# Patient Record
Sex: Female | Born: 1975 | Race: White | Hispanic: No | Marital: Single | Smoking: Never smoker
Health system: Southern US, Community
[De-identification: ages and names within clinical notes are randomized; demographics above are authoritative.]

## PROBLEM LIST (undated history)

## (undated) DIAGNOSIS — F419 Anxiety disorder, unspecified: Secondary | ICD-10-CM

## (undated) DIAGNOSIS — E039 Hypothyroidism, unspecified: Secondary | ICD-10-CM

## (undated) DIAGNOSIS — M797 Fibromyalgia: Secondary | ICD-10-CM

## (undated) DIAGNOSIS — F319 Bipolar disorder, unspecified: Secondary | ICD-10-CM

## (undated) DIAGNOSIS — G5600 Carpal tunnel syndrome, unspecified upper limb: Secondary | ICD-10-CM

## (undated) HISTORY — DX: Fibromyalgia: M79.7

## (undated) HISTORY — DX: Carpal tunnel syndrome, unspecified upper limb: G56.00

## (undated) HISTORY — DX: Anxiety disorder, unspecified: F41.9

## (undated) HISTORY — DX: Bipolar disorder, unspecified: F31.9

## (undated) HISTORY — DX: Hypothyroidism, unspecified: E03.9

---

## 1997-06-28 ENCOUNTER — Inpatient Hospital Stay (HOSPITAL_COMMUNITY): Admission: AD | Admit: 1997-06-28 | Discharge: 1997-06-28 | Payer: Self-pay | Admitting: Obstetrics and Gynecology

## 1997-07-01 ENCOUNTER — Ambulatory Visit (HOSPITAL_COMMUNITY): Admission: RE | Admit: 1997-07-01 | Discharge: 1997-07-01 | Payer: Self-pay | Admitting: Obstetrics and Gynecology

## 1997-07-15 ENCOUNTER — Ambulatory Visit (HOSPITAL_COMMUNITY): Admission: RE | Admit: 1997-07-15 | Discharge: 1997-07-15 | Payer: Self-pay | Admitting: Obstetrics and Gynecology

## 1997-08-25 ENCOUNTER — Inpatient Hospital Stay (HOSPITAL_COMMUNITY): Admission: AD | Admit: 1997-08-25 | Discharge: 1997-08-25 | Payer: Self-pay | Admitting: Obstetrics and Gynecology

## 1997-08-28 ENCOUNTER — Ambulatory Visit (HOSPITAL_COMMUNITY): Admission: RE | Admit: 1997-08-28 | Discharge: 1997-08-28 | Payer: Self-pay | Admitting: Obstetrics and Gynecology

## 1997-10-17 ENCOUNTER — Other Ambulatory Visit: Admission: RE | Admit: 1997-10-17 | Discharge: 1997-10-17 | Payer: Self-pay | Admitting: Obstetrics and Gynecology

## 1997-12-15 ENCOUNTER — Ambulatory Visit (HOSPITAL_COMMUNITY): Admission: RE | Admit: 1997-12-15 | Discharge: 1997-12-15 | Payer: Self-pay | Admitting: Obstetrics and Gynecology

## 1997-12-28 ENCOUNTER — Inpatient Hospital Stay (HOSPITAL_COMMUNITY): Admission: AD | Admit: 1997-12-28 | Discharge: 1997-12-28 | Payer: Self-pay | Admitting: Obstetrics and Gynecology

## 1997-12-29 ENCOUNTER — Encounter: Payer: Self-pay | Admitting: Obstetrics and Gynecology

## 1997-12-29 ENCOUNTER — Inpatient Hospital Stay (HOSPITAL_COMMUNITY): Admission: AD | Admit: 1997-12-29 | Discharge: 1997-12-29 | Payer: Self-pay | Admitting: Obstetrics and Gynecology

## 1997-12-30 ENCOUNTER — Inpatient Hospital Stay (HOSPITAL_COMMUNITY): Admission: AD | Admit: 1997-12-30 | Discharge: 1997-12-30 | Payer: Self-pay | Admitting: Obstetrics and Gynecology

## 1998-01-14 ENCOUNTER — Inpatient Hospital Stay (HOSPITAL_COMMUNITY): Admission: AD | Admit: 1998-01-14 | Discharge: 1998-01-14 | Payer: Self-pay | Admitting: Obstetrics and Gynecology

## 1998-01-23 ENCOUNTER — Ambulatory Visit (HOSPITAL_COMMUNITY): Admission: RE | Admit: 1998-01-23 | Discharge: 1998-01-23 | Payer: Self-pay | Admitting: Obstetrics and Gynecology

## 1998-04-20 ENCOUNTER — Inpatient Hospital Stay (HOSPITAL_COMMUNITY): Admission: AD | Admit: 1998-04-20 | Discharge: 1998-04-20 | Payer: Self-pay | Admitting: Obstetrics and Gynecology

## 1998-04-24 ENCOUNTER — Inpatient Hospital Stay (HOSPITAL_COMMUNITY): Admission: AD | Admit: 1998-04-24 | Discharge: 1998-04-24 | Payer: Self-pay | Admitting: Obstetrics and Gynecology

## 1998-04-27 ENCOUNTER — Encounter (HOSPITAL_COMMUNITY): Admission: RE | Admit: 1998-04-27 | Discharge: 1998-05-07 | Payer: Self-pay | Admitting: Obstetrics and Gynecology

## 1998-05-05 ENCOUNTER — Inpatient Hospital Stay (HOSPITAL_COMMUNITY): Admission: AD | Admit: 1998-05-05 | Discharge: 1998-05-07 | Payer: Self-pay | Admitting: Obstetrics and Gynecology

## 2004-08-23 ENCOUNTER — Other Ambulatory Visit: Admission: RE | Admit: 2004-08-23 | Discharge: 2004-08-23 | Payer: Self-pay | Admitting: Obstetrics and Gynecology

## 2005-03-08 ENCOUNTER — Inpatient Hospital Stay (HOSPITAL_COMMUNITY): Admission: AD | Admit: 2005-03-08 | Discharge: 2005-03-09 | Payer: Self-pay | Admitting: Obstetrics and Gynecology

## 2005-03-25 ENCOUNTER — Inpatient Hospital Stay (HOSPITAL_COMMUNITY): Admission: AD | Admit: 2005-03-25 | Discharge: 2005-03-26 | Payer: Self-pay | Admitting: Obstetrics and Gynecology

## 2005-06-18 ENCOUNTER — Inpatient Hospital Stay (HOSPITAL_COMMUNITY): Admission: AD | Admit: 2005-06-18 | Discharge: 2005-06-18 | Payer: Self-pay | Admitting: Obstetrics and Gynecology

## 2005-08-04 ENCOUNTER — Observation Stay (HOSPITAL_COMMUNITY): Admission: AD | Admit: 2005-08-04 | Discharge: 2005-08-04 | Payer: Self-pay | Admitting: Obstetrics and Gynecology

## 2005-08-04 ENCOUNTER — Inpatient Hospital Stay (HOSPITAL_COMMUNITY): Admission: AD | Admit: 2005-08-04 | Discharge: 2005-08-04 | Payer: Self-pay | Admitting: Obstetrics and Gynecology

## 2005-08-15 ENCOUNTER — Inpatient Hospital Stay (HOSPITAL_COMMUNITY): Admission: AD | Admit: 2005-08-15 | Discharge: 2005-08-15 | Payer: Self-pay | Admitting: Obstetrics and Gynecology

## 2005-08-20 ENCOUNTER — Encounter: Payer: Self-pay | Admitting: Obstetrics and Gynecology

## 2005-08-27 ENCOUNTER — Ambulatory Visit (HOSPITAL_COMMUNITY): Admission: RE | Admit: 2005-08-27 | Discharge: 2005-08-27 | Payer: Self-pay | Admitting: Obstetrics and Gynecology

## 2005-10-11 ENCOUNTER — Inpatient Hospital Stay (HOSPITAL_COMMUNITY): Admission: AD | Admit: 2005-10-11 | Discharge: 2005-10-11 | Payer: Self-pay | Admitting: Obstetrics and Gynecology

## 2005-10-22 ENCOUNTER — Inpatient Hospital Stay (HOSPITAL_COMMUNITY): Admission: AD | Admit: 2005-10-22 | Discharge: 2005-10-24 | Payer: Self-pay | Admitting: Obstetrics and Gynecology

## 2005-12-02 ENCOUNTER — Other Ambulatory Visit: Admission: RE | Admit: 2005-12-02 | Discharge: 2005-12-02 | Payer: Self-pay | Admitting: Obstetrics and Gynecology

## 2006-11-12 IMAGING — US US OB LIMITED
1 series · 14 of 28 positions shown · non-contrast
Comparison: 03/25/05.

CLINICAL DATA: 37 weeks pregnant female with abdominal pain.
 LIMITED OBSTETRICAL ULTRASOUND:

[Series 1: us ob limited · 0.29mm/px · 14 of 38 slices shown]
[im 2/38]
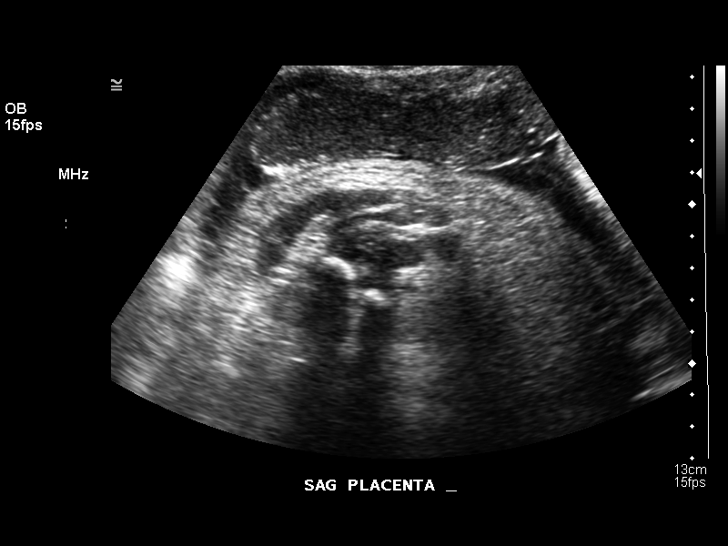
[im 5/38]
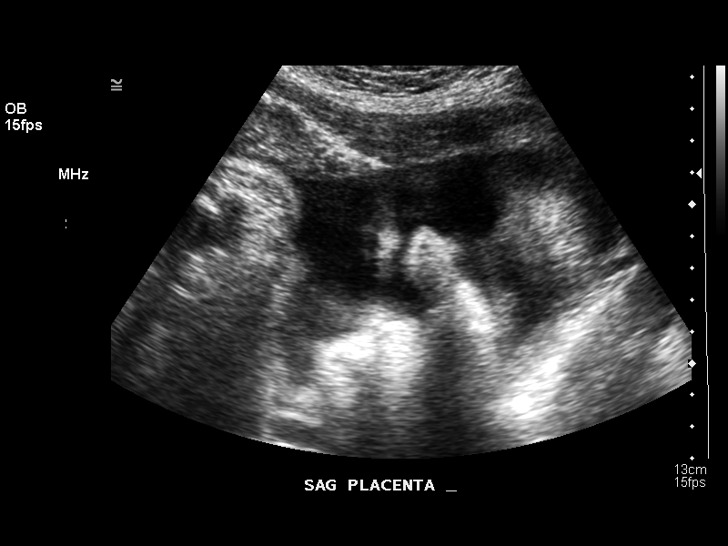
[im 7/38]
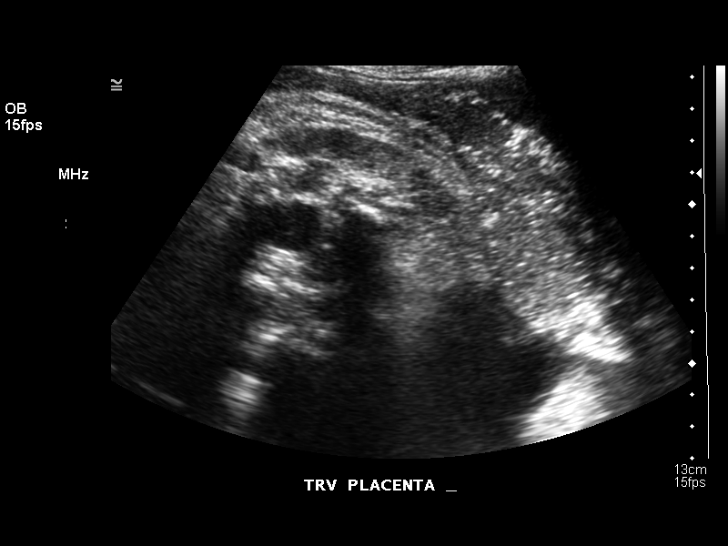
[im 10/38]
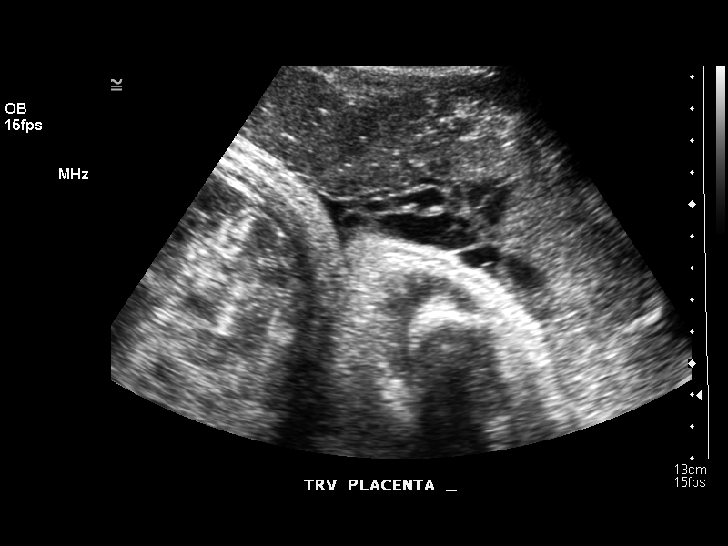
[im 13/38]
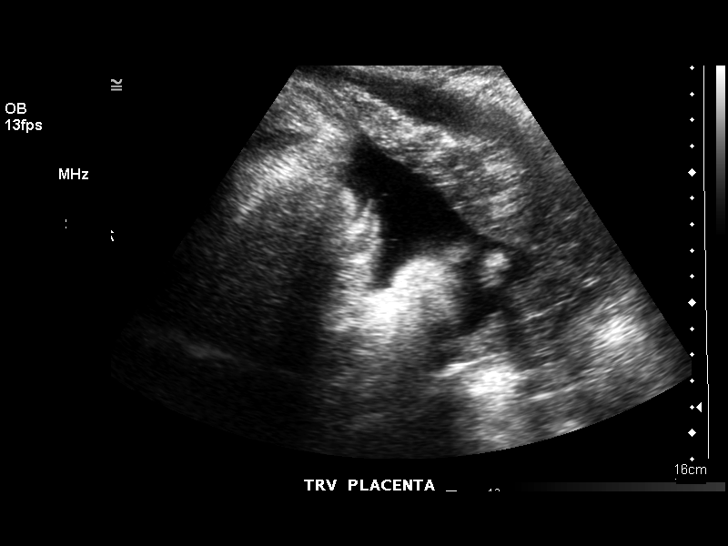
[im 16/38]
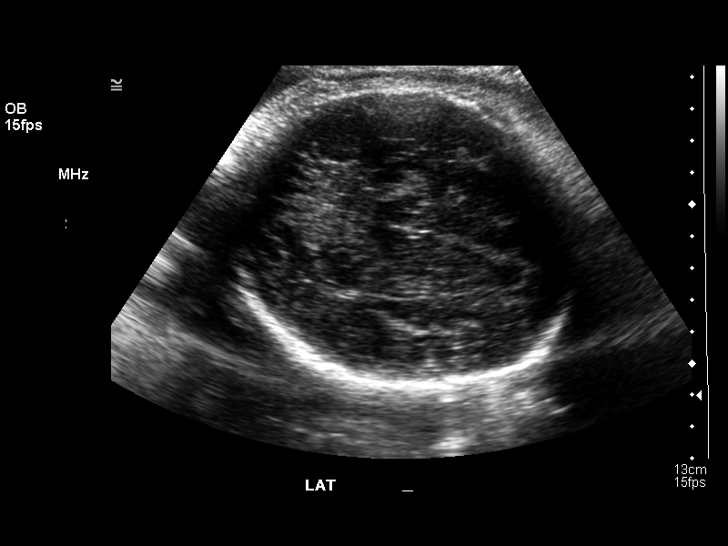
[im 18/38]
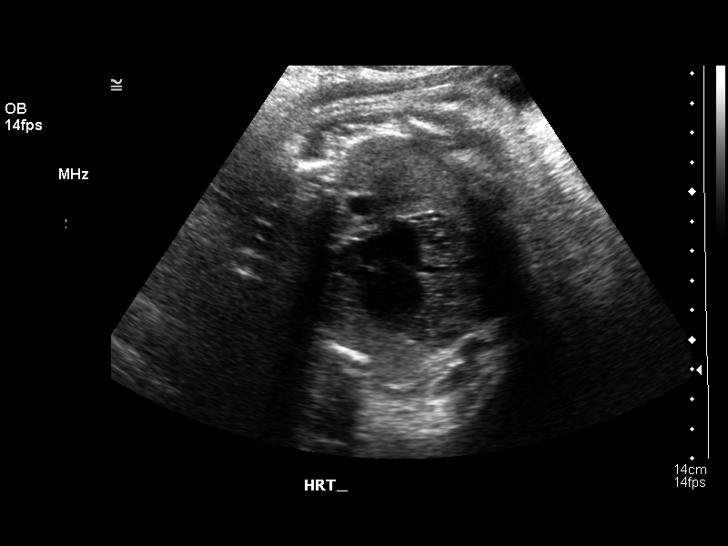
[im 21/38]
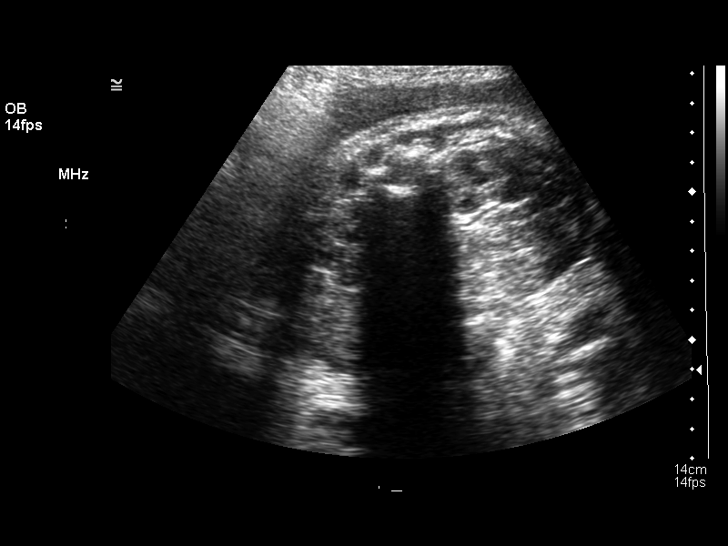
[im 24/38]
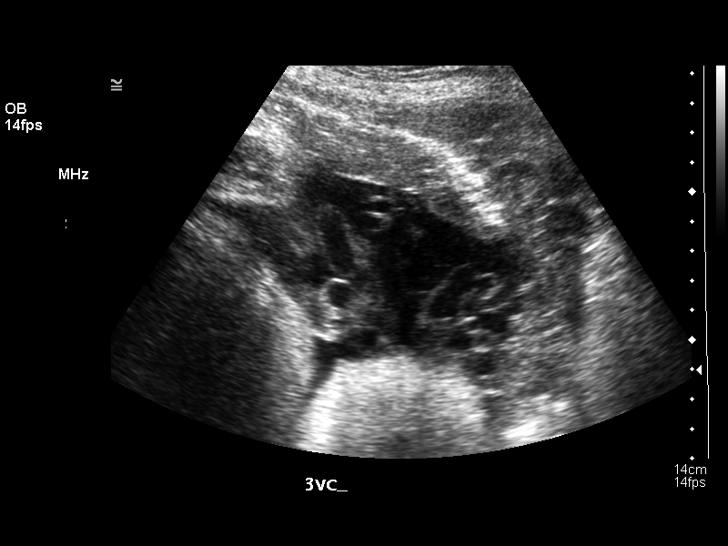
[im 27/38]
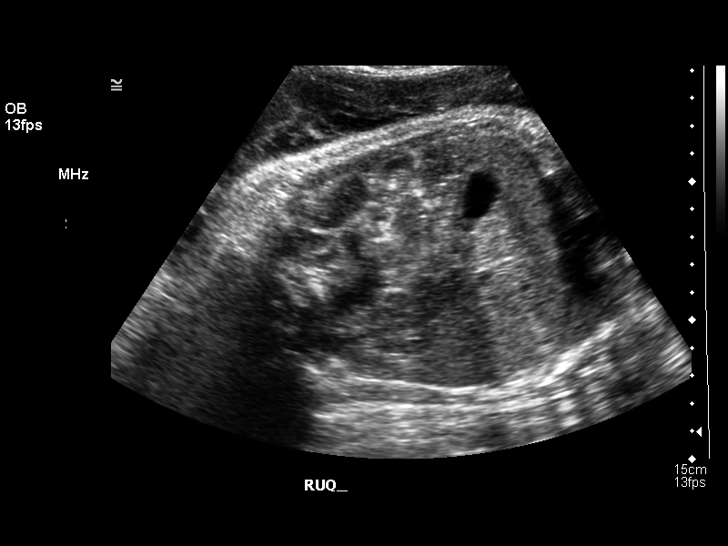
[im 29/38]
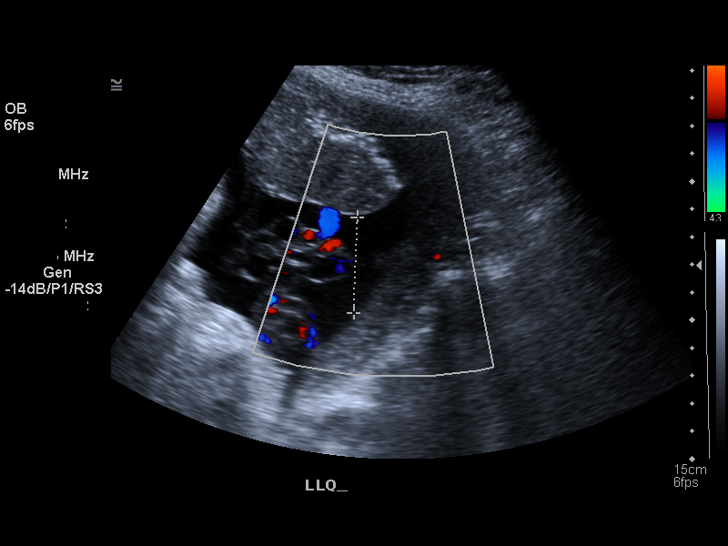
[im 32/38]
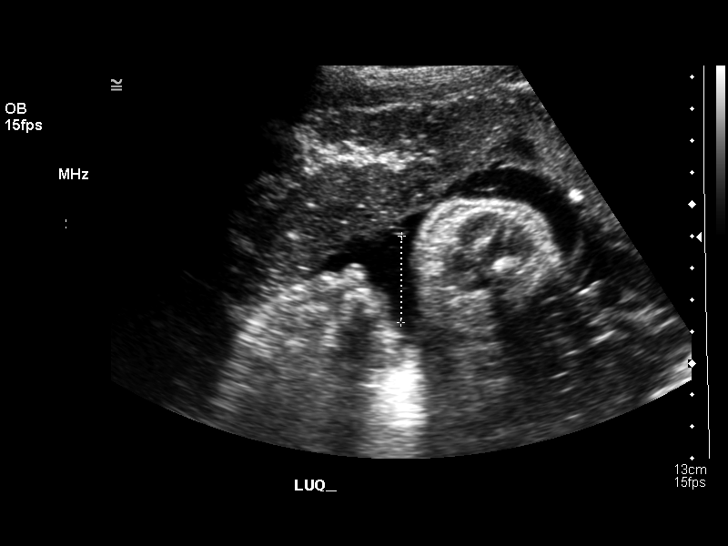
[im 35/38]
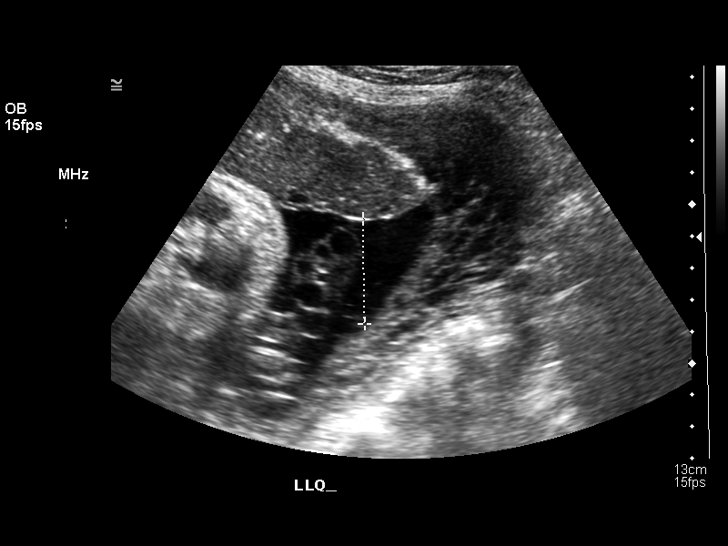
[im 38/38]
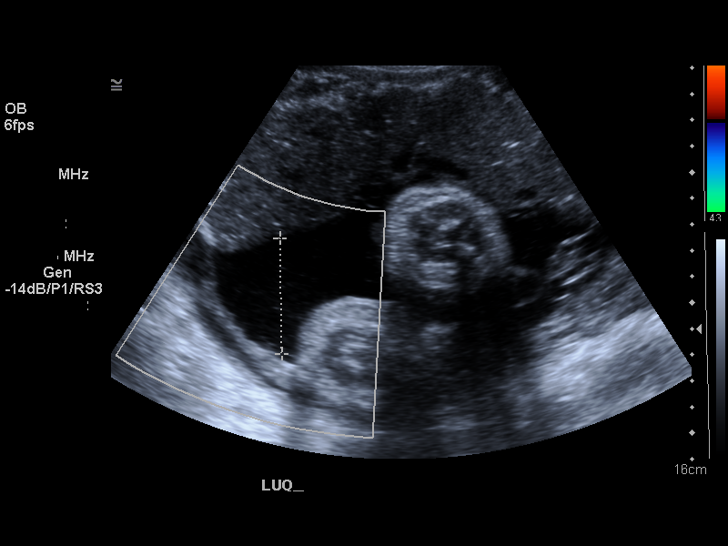

[14 of 28 positions shown; findings below may reference images not displayed]

Number of Fetuses:  1
 Heart Rate:  126 bpm
 Movement:  Yes
 Breathing:  No
 Presentation:  Cephalic
 Placental Location:  Anterior
 Grade:  III
 Previa:  No
 Amniotic Fluid (Subjective):  Normal
 Amniotic Fluid (Objective):  AFI 13.5 cm (8th-28th %ile = 7.5 to 24.4 cm for 37 weeks) 

 Fetal measurements and complete anatomic evaluation were not requested.  The following fetal anatomy was visualized during this exam:  Lateral ventricles, stomach, 3-vessel cord, kidneys, bladder, and diaphragm.

 MATERNAL UTERINE AND ADNEXAL FINDINGS
 Cervix: Not evaluated.
IMPRESSION: Single living intrauterine pregnancy with normal amniotic fluid volume.

## 2008-07-27 ENCOUNTER — Emergency Department (HOSPITAL_BASED_OUTPATIENT_CLINIC_OR_DEPARTMENT_OTHER): Admission: EM | Admit: 2008-07-27 | Discharge: 2008-07-27 | Payer: Self-pay | Admitting: Emergency Medicine

## 2009-10-06 ENCOUNTER — Inpatient Hospital Stay (HOSPITAL_COMMUNITY): Admission: AD | Admit: 2009-10-06 | Discharge: 2009-10-06 | Payer: Self-pay | Admitting: Obstetrics and Gynecology

## 2009-10-06 ENCOUNTER — Ambulatory Visit: Payer: Self-pay | Admitting: Advanced Practice Midwife

## 2009-10-16 ENCOUNTER — Inpatient Hospital Stay (HOSPITAL_COMMUNITY): Admission: AD | Admit: 2009-10-16 | Discharge: 2009-10-16 | Payer: Self-pay | Admitting: Obstetrics and Gynecology

## 2009-10-16 ENCOUNTER — Ambulatory Visit: Payer: Self-pay | Admitting: Gynecology

## 2009-11-11 ENCOUNTER — Ambulatory Visit: Payer: Self-pay | Admitting: Nurse Practitioner

## 2009-11-11 ENCOUNTER — Inpatient Hospital Stay (HOSPITAL_COMMUNITY): Admission: AD | Admit: 2009-11-11 | Discharge: 2009-11-11 | Payer: Self-pay | Admitting: Obstetrics and Gynecology

## 2009-11-14 ENCOUNTER — Inpatient Hospital Stay (HOSPITAL_COMMUNITY): Admission: AD | Admit: 2009-11-14 | Discharge: 2009-11-15 | Payer: Self-pay | Admitting: Obstetrics and Gynecology

## 2009-11-17 ENCOUNTER — Ambulatory Visit: Payer: Self-pay | Admitting: Nurse Practitioner

## 2009-11-17 ENCOUNTER — Inpatient Hospital Stay (HOSPITAL_COMMUNITY): Admission: AD | Admit: 2009-11-17 | Discharge: 2009-11-17 | Payer: Self-pay | Admitting: Obstetrics and Gynecology

## 2009-12-02 ENCOUNTER — Inpatient Hospital Stay (HOSPITAL_COMMUNITY): Admission: AD | Admit: 2009-12-02 | Discharge: 2009-12-02 | Payer: Self-pay | Admitting: Obstetrics and Gynecology

## 2009-12-02 ENCOUNTER — Ambulatory Visit: Payer: Self-pay | Admitting: Nurse Practitioner

## 2009-12-25 ENCOUNTER — Inpatient Hospital Stay (HOSPITAL_COMMUNITY): Admission: AD | Admit: 2009-12-25 | Discharge: 2009-12-25 | Payer: Self-pay | Admitting: Obstetrics and Gynecology

## 2010-01-07 ENCOUNTER — Inpatient Hospital Stay (HOSPITAL_COMMUNITY): Admission: AD | Admit: 2010-01-07 | Discharge: 2010-01-07 | Payer: Self-pay | Admitting: Obstetrics and Gynecology

## 2010-01-07 ENCOUNTER — Ambulatory Visit: Payer: Self-pay | Admitting: Nurse Practitioner

## 2010-01-27 ENCOUNTER — Inpatient Hospital Stay (HOSPITAL_COMMUNITY): Admission: AD | Admit: 2010-01-27 | Discharge: 2010-01-28 | Payer: Self-pay | Admitting: Obstetrics and Gynecology

## 2010-02-01 ENCOUNTER — Inpatient Hospital Stay (HOSPITAL_COMMUNITY): Admission: AD | Admit: 2010-02-01 | Discharge: 2010-02-02 | Payer: Self-pay | Admitting: Obstetrics and Gynecology

## 2010-02-04 ENCOUNTER — Inpatient Hospital Stay (HOSPITAL_COMMUNITY): Admission: AD | Admit: 2010-02-04 | Discharge: 2010-02-07 | Payer: Self-pay | Admitting: Obstetrics and Gynecology

## 2010-03-18 ENCOUNTER — Ambulatory Visit: Payer: Self-pay | Admitting: Emergency Medicine

## 2010-03-18 DIAGNOSIS — F3181 Bipolar II disorder: Secondary | ICD-10-CM

## 2010-03-18 DIAGNOSIS — IMO0002 Reserved for concepts with insufficient information to code with codable children: Secondary | ICD-10-CM

## 2010-03-19 ENCOUNTER — Ambulatory Visit: Payer: Self-pay | Admitting: Emergency Medicine

## 2010-03-20 ENCOUNTER — Encounter: Payer: Self-pay | Admitting: Emergency Medicine

## 2010-03-25 ENCOUNTER — Telehealth (INDEPENDENT_AMBULATORY_CARE_PROVIDER_SITE_OTHER): Payer: Self-pay | Admitting: *Deleted

## 2010-04-21 ENCOUNTER — Inpatient Hospital Stay (HOSPITAL_COMMUNITY)
Admission: AD | Admit: 2010-04-21 | Discharge: 2010-04-21 | Payer: Self-pay | Source: Home / Self Care | Attending: Obstetrics and Gynecology | Admitting: Obstetrics and Gynecology

## 2010-05-12 ENCOUNTER — Encounter: Payer: Self-pay | Admitting: Obstetrics and Gynecology

## 2010-05-23 NOTE — Assessment & Plan Note (Signed)
Summary: BOIL UNDER ARM/TJ    Current Allergies: No known allergies History of Present Illness History of Present Illness: Patient returns to clinic today with abscess in R axilla.  She was here last night and given Rx for Bactrim DS x 10 days.  It was not ready to I&D, so patient took antibiotics and used heating pad all night and all day.  It has become more swollen and more painful and returned.  No F/C/N/V.  No drainage yet.   Physical Exam General appearance: well developed, well nourished, no acute distress Heart: regular rate and  rhythm, no murmur Extremities: normal extremities Skin: see below MSE: oriented to time, place, and person Abscess in R axilla approx 2cm and very swollen.  It is very fluctuant now, no induration and very TTP.  No drainage or pus or bleeding is present.  Plan New Medications/Changes: BACTRIM DS 800-160 MG TABS (SULFAMETHOXAZOLE-TRIMETHOPRIM) 1 tab by mouth two times a day for 10 days  #20 x 0, 03/19/2010, Hoyt Koch MD BACTRIM DS 800-160 MG TABS (SULFAMETHOXAZOLE-TRIMETHOPRIM) 1 tab by mouth two times a day for 10 days  #20 x 0, 03/19/2010, Hoyt Koch MD  New Orders: Est. Patient Level III [04540] I&D Abscess, Simple / Single [10060] T-Culture, Wound [87070/[id number]] Planning Comments:   A wound culture was taken and sent to the lab.  I gave another Rx for Bactrim to increase the dose to the MRSA dose of 2 tabs two times a day for 10 days.  Ibuprofen as needed for pain.  Continue with warm compresses to promote further drainage.  Informed the patient that if it puffs back up again, may have to repeat I&D and use packing gauze.  Keep covered with gauze for a few days in case it drains but don't block it up with any ointments.  Follow-up with your primary care physician if not improving or if getting worse.   The patient and/or caregiver has been counseled thoroughly with regard to medications prescribed including dosage, schedule,  interactions, rationale for use, and possible side effects and they verbalize understanding.  Diagnoses and expected course of recovery discussed and will return if not improved as expected or if the condition worsens. Patient and/or caregiver verbalized understanding.   PROCEDURE:   I & D Site: R axilla Size: 2cm Anesthesia: 2% lidocaine without epi Procedure: Cleaned wound with betadine and saline.  Used 1cc Lido w/out Epi to numb overlying skin.  Then punctured with blade.  Expressed a large amount of purulent material from wound.  A culture was taken and sent to the lab.  Then wound covered with gauze and Coban.  No packing tape was used because it should be able to drain on it's own.  Pt tolerated procedure well. Prescriptions: BACTRIM DS 800-160 MG TABS (SULFAMETHOXAZOLE-TRIMETHOPRIM) 1 tab by mouth two times a day for 10 days  #20 x 0   Entered and Authorized by:   Hoyt Koch MD   Signed by:   Hoyt Koch MD on 03/19/2010   Method used:   Print then Give to Patient   RxID:   [id number] BACTRIM DS 800-160 MG TABS (SULFAMETHOXAZOLE-TRIMETHOPRIM) 1 tab by mouth two times a day for 10 days  #20 x 0   Entered and Authorized by:   Hoyt Koch MD   Signed by:   Hoyt Koch MD on 03/19/2010   Method used:   Electronically to        [facility] [phone]* (retail)  [address], [place]  [address]       Ph: [phone] or [phone]       Fax: [phone]   RxID:   [id number]   Orders Added: 1)  Est. Patient Level III [27253] 2)  I&D Abscess, Simple / Single [10060] 3)  T-Culture, Wound [87070/[id number]]  Appended Document: BOIL UNDER ARM/TJ Wt: 153 T: 98.4 BP: 134/77 P: 91 R: 18 SpO2: 100%

## 2010-05-23 NOTE — Assessment & Plan Note (Signed)
Summary: COugh/runny nose - greenish yellow, fever x 2 dys rm 4   Vital Signs:  Patient Profile:   34 Years Old Female CC:      Cold & URI symptoms Height:     67 inches Weight:      154 pounds O2 Sat:      98 % O2 treatment:    Room Air Temp:     98.6 degrees F oral Pulse rate:   79 / minute Pulse rhythm:   regular Resp:     16 per minute BP sitting:   111 / 76  (left arm) Cuff size:   regular  Vitals Entered By: Areta Haber CMA (March 18, 2010 2:30 PM)                  Current Allergies: No known allergies History of Present Illness Chief Complaint: Cold & URI symptoms History of Present Illness: Mild URI symptoms (runny nose, congestion, productive cough, no fever or chills) for a few days.  She also complains of a large red tender bump in her R armpit of the same duration.  No previous bumps before.  She has been trying a heating pad with no success. Pain stays in armpit and doesn't radiate.    Current Problems: ABSCESS, AXILLA, RIGHT (ICD-682.3) UPPER RESPIRATORY INFECTION, ACUTE (ICD-465.9) DEPRESSION (ICD-311)   Current Meds ZOLOFT 50 MG TABS (SERTRALINE HCL) 1 tab by mouth once daily XANAX 1 MG TABS (ALPRAZOLAM) as needed MUCINEX 600 MG XR12H-TAB (GUAIFENESIN) as directed VICKS NYQUIL MULTI-SYMPTOM 15-6.25-325 MG CAPS (DM-DOXYLAMINE-ACETAMINOPHEN) as directed BACTRIM DS 800-160 MG TABS (SULFAMETHOXAZOLE-TRIMETHOPRIM) 1 tab by mouth two times a day for 10 days  REVIEW OF SYSTEMS Constitutional Symptoms       Complains of fever and night sweats.     Denies chills, weight loss, weight gain, and fatigue.  Eyes       Denies change in vision, eye pain, eye discharge, glasses, contact lenses, and eye surgery. Ear/Nose/Throat/Mouth       Complains of frequent runny nose, frequent nose bleeds, sinus problems, and hoarseness.      Denies hearing loss/aids, change in hearing, ear pain, ear discharge, dizziness, sore throat, and tooth pain or bleeding.       Comments: greenish yellowish x 2 dy Respiratory       Complains of productive cough.      Denies dry cough, wheezing, shortness of breath, asthma, bronchitis, and emphysema/COPD.  Cardiovascular       Denies murmurs, chest pain, and tires easily with exhertion.    Gastrointestinal       Denies stomach pain, nausea/vomiting, diarrhea, constipation, blood in bowel movements, and indigestion. Genitourniary       Denies painful urination, kidney stones, and loss of urinary control. Neurological       Denies paralysis, seizures, and fainting/blackouts. Musculoskeletal       Denies muscle pain, joint pain, joint stiffness, decreased range of motion, redness, swelling, muscle weakness, and gout.  Skin       Complains of unusual moles/lumps or sores.      Denies bruising and hair/skin or nail changes.      Comments: under R arm x 2 dys Psych       Denies mood changes, temper/anger issues, anxiety/stress, speech problems, depression, and sleep problems. Other Comments: pt has not seen her PCP for this.   Past History:  Past Medical History: Depression  Past Surgical History: Wisdom teeth removed Kidney Stones  Family History:  Family History Lung cancer  Social History: Single Never Smoked Alcohol use-no Drug use-no Regular exercise-no Smoking Status:  never Drug Use:  no Does Patient Exercise:  no Physical Exam General appearance: well developed, well nourished, no acute distress Ears: normal, no lesions or deformities Nasal: swollen red turbinates with congestion Oral/Pharynx: clear post nasal drip Chest/Lungs: no rales, wheezes, or rhonchi bilateral, breath sounds equal without effort Heart: regular rate and  rhythm, no murmur Skin: R armpit with swollen erythematous abscess, no induration, soft, approx 1.5cm in diameter.  No drainage, pus, or bleeding. MSE: oriented to time, place, and person Assessment New Problems: ABSCESS, AXILLA, RIGHT (ICD-682.3) UPPER RESPIRATORY  INFECTION, ACUTE (ICD-465.9) DEPRESSION (ICD-311)   Patient Education: Patient and/or caregiver instructed in the following: rest, fluids, Tylenol prn, Ibuprofen prn.  Plan New Medications/Changes: BACTRIM DS 800-160 MG TABS (SULFAMETHOXAZOLE-TRIMETHOPRIM) 1 tab by mouth two times a day for 10 days  #20 x 0, 03/18/2010, Hoyt Koch MD  New Orders: New Patient Level III [phone] Planning Comments:   Use Bactrim-DS for 10 days for her abscess.  If she also has any bacterial component to her URI, this would help as well (however I would suspect more of a viral cause).  Use frequent heating pad to armpit to promote drainage.  Due to the location, I would like to hold off for now on I&D, but this may be needed in a few days if not improving.    The patient and/or caregiver has been counseled thoroughly with regard to medications prescribed including dosage, schedule, interactions, rationale for use, and possible side effects and they verbalize understanding.  Diagnoses and expected course of recovery discussed and will return if not improved as expected or if the condition worsens. Patient and/or caregiver verbalized understanding.  Prescriptions: BACTRIM DS 800-160 MG TABS (SULFAMETHOXAZOLE-TRIMETHOPRIM) 1 tab by mouth two times a day for 10 days  #20 x 0   Entered and Authorized by:   Hoyt Koch MD   Signed by:   Hoyt Koch MD on 03/18/2010   Method used:   Print then Give to Patient   RxID:   [id number]   Orders Added: 1)  New Patient Level III [08657]

## 2010-05-23 NOTE — Progress Notes (Signed)
  Phone Note Outgoing Call Call back at [facility] Phone [phone]   Call placed by: Emilio Math,  March 25, 2010 2:22 PM Call placed to: Patient Summary of Call: left msg

## 2010-07-01 LAB — HERPES SIMPLEX VIRUS CULTURE: Culture: DETECTED

## 2010-07-01 LAB — WET PREP, GENITAL
Clue Cells Wet Prep HPF POC: NONE SEEN
Trich, Wet Prep: NONE SEEN
Yeast Wet Prep HPF POC: NONE SEEN

## 2010-07-01 LAB — URINALYSIS, ROUTINE W REFLEX MICROSCOPIC
Bilirubin Urine: NEGATIVE
Glucose, UA: NEGATIVE mg/dL
Ketones, ur: NEGATIVE mg/dL
Nitrite: NEGATIVE
Protein, ur: NEGATIVE mg/dL
Specific Gravity, Urine: 1.01 (ref 1.005–1.030)
Urobilinogen, UA: 0.2 mg/dL (ref 0.0–1.0)
pH: 6 (ref 5.0–8.0)

## 2010-07-01 LAB — URINE MICROSCOPIC-ADD ON

## 2010-07-01 LAB — POCT PREGNANCY, URINE: Preg Test, Ur: NEGATIVE

## 2010-07-01 LAB — GC/CHLAMYDIA PROBE AMP, GENITAL
Chlamydia, DNA Probe: NEGATIVE
GC Probe Amp, Genital: NEGATIVE

## 2010-07-03 LAB — ABO/RH: ABO/RH(D): O POS

## 2010-07-03 LAB — CBC
HCT: 32.3 % — ABNORMAL LOW (ref 36.0–46.0)
HCT: 38.9 % (ref 36.0–46.0)
Hemoglobin: 11.1 g/dL — ABNORMAL LOW (ref 12.0–15.0)
Hemoglobin: 13.3 g/dL (ref 12.0–15.0)
MCH: 31.1 pg (ref 26.0–34.0)
MCH: 31.6 pg (ref 26.0–34.0)
MCHC: 34.1 g/dL (ref 30.0–36.0)
MCHC: 34.5 g/dL (ref 30.0–36.0)
MCV: 91.2 fL (ref 78.0–100.0)
MCV: 91.7 fL (ref 78.0–100.0)
Platelets: 136 10*3/uL — ABNORMAL LOW (ref 150–400)
Platelets: 168 10*3/uL (ref 150–400)
RBC: 3.52 MIL/uL — ABNORMAL LOW (ref 3.87–5.11)
RBC: 4.27 MIL/uL (ref 3.87–5.11)
RDW: 13.3 % (ref 11.5–15.5)
RDW: 13.5 % (ref 11.5–15.5)
WBC: 10.3 10*3/uL (ref 4.0–10.5)
WBC: 13.9 10*3/uL — ABNORMAL HIGH (ref 4.0–10.5)

## 2010-07-03 LAB — RPR: RPR Ser Ql: NONREACTIVE

## 2010-07-04 ENCOUNTER — Inpatient Hospital Stay (INDEPENDENT_AMBULATORY_CARE_PROVIDER_SITE_OTHER)
Admission: RE | Admit: 2010-07-04 | Discharge: 2010-07-04 | Disposition: A | Discharge status: Home / Self Care | Payer: Medicaid Other | Source: Ambulatory Visit | Attending: Family Medicine | Admitting: Family Medicine

## 2010-07-04 ENCOUNTER — Encounter: Payer: Self-pay | Admitting: Family Medicine

## 2010-07-04 DIAGNOSIS — N3000 Acute cystitis without hematuria: Secondary | ICD-10-CM

## 2010-07-04 LAB — CONVERTED CEMR LAB: pH: 6

## 2010-07-05 ENCOUNTER — Encounter: Payer: Self-pay | Admitting: Family Medicine

## 2010-07-05 LAB — URINALYSIS, ROUTINE W REFLEX MICROSCOPIC
Bilirubin Urine: NEGATIVE
Glucose, UA: NEGATIVE mg/dL
Hgb urine dipstick: NEGATIVE
Ketones, ur: NEGATIVE mg/dL
Nitrite: NEGATIVE
Protein, ur: NEGATIVE mg/dL
Specific Gravity, Urine: 1.01 (ref 1.005–1.030)
Urobilinogen, UA: 0.2 mg/dL (ref 0.0–1.0)
pH: 7 (ref 5.0–8.0)

## 2010-07-05 LAB — FETAL FIBRONECTIN: Fetal Fibronectin: NEGATIVE

## 2010-07-06 ENCOUNTER — Telehealth (INDEPENDENT_AMBULATORY_CARE_PROVIDER_SITE_OTHER): Payer: Self-pay | Admitting: *Deleted

## 2010-07-06 LAB — URINALYSIS, ROUTINE W REFLEX MICROSCOPIC
Bilirubin Urine: NEGATIVE
Bilirubin Urine: NEGATIVE
Glucose, UA: NEGATIVE mg/dL
Glucose, UA: NEGATIVE mg/dL
Hgb urine dipstick: NEGATIVE
Hgb urine dipstick: NEGATIVE
Ketones, ur: NEGATIVE mg/dL
Ketones, ur: NEGATIVE mg/dL
Nitrite: NEGATIVE
Nitrite: NEGATIVE
Protein, ur: NEGATIVE mg/dL
Protein, ur: NEGATIVE mg/dL
Specific Gravity, Urine: 1.01 (ref 1.005–1.030)
Specific Gravity, Urine: >1.03 — ABNORMAL HIGH (ref 1.005–1.030)
Urobilinogen, UA: 0.2 mg/dL (ref 0.0–1.0)
Urobilinogen, UA: 0.2 mg/dL (ref 0.0–1.0)
pH: 5.5 (ref 5.0–8.0)
pH: 7 (ref 5.0–8.0)

## 2010-07-06 LAB — WET PREP, GENITAL
Clue Cells Wet Prep HPF POC: NONE SEEN
Trich, Wet Prep: NONE SEEN
Yeast Wet Prep HPF POC: NONE SEEN

## 2010-07-06 LAB — GC/CHLAMYDIA PROBE AMP, URINE
Chlamydia, Swab/Urine, PCR: NEGATIVE
GC Probe Amp, Urine: NEGATIVE

## 2010-07-06 LAB — FETAL FIBRONECTIN: Fetal Fibronectin: NEGATIVE

## 2010-07-07 LAB — URINALYSIS, ROUTINE W REFLEX MICROSCOPIC
Bilirubin Urine: NEGATIVE
Glucose, UA: NEGATIVE mg/dL
Hgb urine dipstick: NEGATIVE
Nitrite: NEGATIVE
Protein, ur: NEGATIVE mg/dL
Specific Gravity, Urine: 1.01 (ref 1.005–1.030)
Urobilinogen, UA: 0.2 mg/dL (ref 0.0–1.0)

## 2010-07-07 LAB — URINE CULTURE

## 2010-07-07 LAB — URINE MICROSCOPIC-ADD ON

## 2010-07-07 LAB — GC/CHLAMYDIA PROBE AMP, GENITAL
Chlamydia, DNA Probe: NEGATIVE
GC Probe Amp, Genital: NEGATIVE

## 2010-07-07 LAB — WET PREP, GENITAL: Trich, Wet Prep: NONE SEEN

## 2010-07-09 NOTE — Assessment & Plan Note (Signed)
Summary: POSSIBLE UTI Room 5   Vital Signs:  Patient Profile:   35 Years Old Female CC:      Painful urination x 2 days LMP:     06/13/2010 Height:     67 inches Weight:      145 pounds O2 Sat:      98 % O2 treatment:    Room Air Temp:     97.9 degrees F oral Pulse rate:   114 / minute Pulse rhythm:   regular Resp:     16 per minute BP sitting:   124 / 85  (left arm) Cuff size:   regular  Vitals Entered By: Emilio Math (July 04, 2010 12:18 PM)  Menstrual History: LMP (date): 06/13/2010                  Current Allergies: No known allergies History of Present Illness Chief Complaint: Painful urination x 2 days History of Present Illness:  Subjective:  Patient presents complaining of UTI symptoms for 2 days.  Complains of dysuria, frequency, nocturia,  and urgency.  No hematuria.  No abnormal vaginal discharge.  No fever/chills/sweats.  No abdominal pain.  No flank pain.  No nausea/vomiting.  Last menstrual period normal.  She is finishing up a course of Bactrim for a MRSA infection.  Current Meds ZOLOFT 50 MG TABS (SERTRALINE HCL) 1 tab by mouth once daily XANAX 1 MG TABS (ALPRAZOLAM) as needed BACTRIM 400-80 MG TABS (SULFAMETHOXAZOLE-TRIMETHOPRIM)  CELEBREX 100 MG CAPS (CELECOXIB)  SAVELLA 100 MG TABS (MILNACIPRAN HCL)  CIPROFLOXACIN HCL 500 MG TABS (CIPROFLOXACIN HCL) 1 by mouth q12hr PYRIDIUM 200 MG TABS (PHENAZOPYRIDINE HCL) 1 by mouth three times a day pc  REVIEW OF SYSTEMS Constitutional Symptoms      Denies fever, chills, night sweats, weight loss, weight gain, and fatigue.  Eyes       Denies change in vision, eye pain, eye discharge, glasses, contact lenses, and eye surgery. Ear/Nose/Throat/Mouth       Denies hearing loss/aids, change in hearing, ear pain, ear discharge, dizziness, frequent runny nose, frequent nose bleeds, sinus problems, sore throat, hoarseness, and tooth pain or bleeding.  Respiratory       Denies dry cough, productive cough,  wheezing, shortness of breath, asthma, bronchitis, and emphysema/COPD.  Cardiovascular       Denies murmurs, chest pain, and tires easily with exhertion.    Gastrointestinal       Denies stomach pain, nausea/vomiting, diarrhea, constipation, blood in bowel movements, and indigestion. Genitourniary       Complains of painful urination.      Denies kidney stones and loss of urinary control. Neurological       Denies paralysis, seizures, and fainting/blackouts. Musculoskeletal       Denies muscle pain, joint pain, joint stiffness, decreased range of motion, redness, swelling, muscle weakness, and gout.  Skin       Denies bruising, unusual mles/lumps or sores, and hair/skin or nail changes.  Psych       Denies mood changes, temper/anger issues, anxiety/stress, speech problems, depression, and sleep problems.  Past History:  Past Medical History: Reviewed history from 03/18/2010 and no changes required. Depression  Past Surgical History: Reviewed history from 03/18/2010 and no changes required. Wisdom teeth removed Kidney Stones  Family History: Reviewed history from 03/18/2010 and no changes required. Family History Lung cancer  Social History: Reviewed history from 03/18/2010 and no changes required. Single Never Smoked Alcohol use-no Drug use-no Regular exercise-no   Objective:  Appearance:  Patient appears healthy, stated age, and in no acute distress  Eyes:  Pupils are equal, round, and reactive to light and accomdation.  Extraocular movement is intact.  Conjunctivae are not inflamed.  Lungs:  Clear to auscultation.  Breath sounds are equal.  Heart:  Regular rate and rhythm without murmurs, rubs, or gallops.  Abdomen:  Nontender without masses or hepatosplenomegaly.  Bowel sounds are present.  No CVA or flank tenderness.  urinalysis (dipstick): 2+ blood, trace leuks Assessment New Problems: ACUTE CYSTITIS (ICD-595.0)   Plan New Medications/Changes: PYRIDIUM 200  MG TABS (PHENAZOPYRIDINE HCL) 1 by mouth three times a day pc  #6 x 0, 07/04/2010, Donna Christen MD CIPROFLOXACIN HCL 500 MG TABS (CIPROFLOXACIN HCL) 1 by mouth q12hr  #14 x 0, 07/04/2010, Donna Christen MD  New Orders: Urinalysis [81003-65000] T-Culture, Urine [[id number]] Est. Patient Level III [09811] Planning Comments:   Urine culture pending. Increased fluid intake.  Rx for Cipro and Pyridium.  Finish Bactrim Return for worsening symptoms or failure to resolve   The patient and/or caregiver has been counseled thoroughly with regard to medications prescribed including dosage, schedule, interactions, rationale for use, and possible side effects and they verbalize understanding.  Diagnoses and expected course of recovery discussed and will return if not improved as expected or if the condition worsens. Patient and/or caregiver verbalized understanding.  Prescriptions: PYRIDIUM 200 MG TABS (PHENAZOPYRIDINE HCL) 1 by mouth three times a day pc  #6 x 0   Entered and Authorized by:   Donna Christen MD   Signed by:   Donna Christen MD on 07/04/2010   Method used:   Print then Give to Patient   RxID:   [id number] CIPROFLOXACIN HCL 500 MG TABS (CIPROFLOXACIN HCL) 1 by mouth q12hr  #14 x 0   Entered and Authorized by:   Donna Christen MD   Signed by:   Donna Christen MD on 07/04/2010   Method used:   Print then Give to Patient   RxID:   [id number]   Orders Added: 1)  Urinalysis [81003-65000] 2)  T-Culture, Urine [[id number]] 3)  Est. Patient Level III [64403]    Laboratory Results   Urine Tests  Date/Time Received: July 04, 2010 12:36 PM  Date/Time Reported: July 04, 2010 12:36 PM   Routine Urinalysis   Color: yellow Appearance: Clear Glucose: negative   (Normal Range: Negative) Bilirubin: 1+1+   (Normal Range: Negative) Ketone: 1+   (Normal Range: Negative) Spec. Gravity: >=1.030   (Normal Range: 1.003-1.035) Blood: 2+   (Normal Range: Negative) pH: 6.0    (Normal Range: 5.0-8.0) Protein: negative   (Normal Range: Negative) Urobilinogen: 0.2   (Normal Range: 0-1) Nitrite: negative   (Normal Range: Negative) Leukocyte Esterace: trace   (Normal Range: Negative)    Urine HCG: negative

## 2010-07-09 NOTE — Progress Notes (Signed)
  Phone Note Outgoing Call   Call placed by: Clemens Catholic LPN,  July 06, 2010 3:11 PM Call placed to: Patient Summary of Call: call back: called to follow up with pt. no answer @ her home #. Initial call taken by: Clemens Catholic LPN,  July 06, 2010 3:11 PM

## 2010-08-02 ENCOUNTER — Ambulatory Visit (HOSPITAL_COMMUNITY): Payer: Self-pay | Admitting: Psychiatry

## 2010-08-15 ENCOUNTER — Ambulatory Visit (HOSPITAL_COMMUNITY): Payer: Self-pay | Admitting: Psychiatry

## 2010-08-19 ENCOUNTER — Ambulatory Visit (INDEPENDENT_AMBULATORY_CARE_PROVIDER_SITE_OTHER): Payer: Medicaid Other | Admitting: Psychiatry

## 2010-08-19 DIAGNOSIS — F319 Bipolar disorder, unspecified: Secondary | ICD-10-CM

## 2010-09-06 NOTE — Discharge Summary (Signed)
NAME:  Tonya Montgomery, Tonya Montgomery NO.:  [id number]   MEDICAL RECORD NO.:  [id number]          PATIENT TYPE:  INP   LOCATION:  9104                          FACILITY:  [facility]   PHYSICIAN:  Zenaida Niece, M.D.DATE OF BIRTH:  10-27-1975   DATE OF ADMISSION:  10/22/2005  DATE OF DISCHARGE:  10/24/2005                                 DISCHARGE SUMMARY   ADMISSION DIAGNOSIS:  Intrauterine pregnancy at 39 weeks.   DISCHARGE DIAGNOSIS:  Intrauterine pregnancy at 39 weeks.   PROCEDURES:  On October 22, 2005, she had a spontaneous vaginal delivery.   HISTORY AND PHYSICAL:  This is a 35 year old white female, gravida 4, para 2-  0-1-2, with an EGA of [redacted] weeks, who presents with a complaint of  contractions.  Evaluation in triage revealed her cervix to be 2-3 cm dilated  with irregular contractions, and, per the nurse, she changed to 3 cm.   PRENATAL LABORATORIES:  Blood type is O+ with a negative antibody screen,  RPR nonreactive, rubella immune, hepatitis B surface antigen negative, HIV  negative, gonorrhea and chlamydia negative, triple screen normal, 1-hour  Glucola 163, 3-hour GTT 93, 183, 127 and 130, and group B strep was  negative.  Prenatal care complicated by prenatal ultrasound consistent with  bilateral club foot in the fetus and preterm contractions and back and  pelvic pain.  She also had elevated liver functions at 30-32 weeks of  questionable etiology, and this resolved spontaneously.   PAST OBSTETRICAL HISTORY:  One spontaneous abortion and 2 vaginal deliveries  at term with forceps, 7 pounds 14 ounces and 9 pounds 11 ounces.   PAST MEDICAL HISTORY:  Anxiety and depression.   PAST SURGICAL HISTORY:  1.  Wisdom tooth removal.  2.  Ureteral stent.  3.  D&C.   CURRENT MEDICATIONS:  Percocet p.r.n. pain.   PHYSICAL EXAMINATION:  VITAL SIGNS:  She is afebrile with stable vital  signs.  Fetal heart tracing is reactive with contractions every 3-5 minutes.  ABDOMEN:   Abdomen is gravid, nontender with an estimated fetal weight of 8  pounds.  PELVIC:  Cervix on my first exam is 4,70, -1, vertex presentation, and  amniotomy revealed clear fluid.   HOSPITAL COURSE:  The patient was admitted possibly in early labor.  She was  put on Pitocin for augmentation and then had amniotomy performed for  augmentation also.  She received her epidural prior to amniotomy.  After  amniotomy, she progressed to complete, pushed well and on the afternoon of  October 22, 2005 had the vaginal delivery of a viable female infant with Apgars of  8/9 who weighed 9 pounds 11 ounces over a second-degree midline episiotomy.  The placenta delivered spontaneous and was intact.  Second-degree episiotomy  was repaired with 3-0 Vicryl.  Estimated blood loss was less than 500 cc.  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  Postpartum, the patient  had no complications.  Pre-delivery hemoglobin of 13.3, post-delivery 11.4.  On postpartum #2, she was felt to be stable enough for discharge home.   DISCHARGE INSTRUCTIONS:  1.  Regular  diet.  2.  Pelvic rest.  3.  Follow-up in 6 weeks.   MEDICATIONS:  She is to continue her Percocet p.r.n.   She was given our discharge pamphlet.      Zenaida Niece, M.D.  Electronically Signed     TDM/MEDQ  D:  10/24/2005  T:  10/24/2005  Job:  [id number]

## 2010-09-19 ENCOUNTER — Encounter (INDEPENDENT_AMBULATORY_CARE_PROVIDER_SITE_OTHER): Payer: Medicaid Other | Admitting: Psychiatry

## 2010-09-19 DIAGNOSIS — F319 Bipolar disorder, unspecified: Secondary | ICD-10-CM

## 2010-10-01 ENCOUNTER — Inpatient Hospital Stay (INDEPENDENT_AMBULATORY_CARE_PROVIDER_SITE_OTHER)
Admission: RE | Admit: 2010-10-01 | Discharge: 2010-10-01 | Disposition: A | Discharge status: Home / Self Care | Payer: Medicaid Other | Source: Ambulatory Visit | Attending: Emergency Medicine | Admitting: Emergency Medicine

## 2010-10-01 ENCOUNTER — Encounter: Payer: Self-pay | Admitting: Emergency Medicine

## 2010-10-01 DIAGNOSIS — J069 Acute upper respiratory infection, unspecified: Secondary | ICD-10-CM

## 2010-10-31 ENCOUNTER — Encounter (INDEPENDENT_AMBULATORY_CARE_PROVIDER_SITE_OTHER): Payer: Medicaid Other | Admitting: Psychiatry

## 2010-10-31 DIAGNOSIS — F319 Bipolar disorder, unspecified: Secondary | ICD-10-CM

## 2010-12-12 ENCOUNTER — Encounter (INDEPENDENT_AMBULATORY_CARE_PROVIDER_SITE_OTHER): Payer: Medicaid Other | Admitting: Psychiatry

## 2010-12-12 DIAGNOSIS — F319 Bipolar disorder, unspecified: Secondary | ICD-10-CM

## 2011-01-23 ENCOUNTER — Encounter (INDEPENDENT_AMBULATORY_CARE_PROVIDER_SITE_OTHER): Payer: Medicaid Other | Admitting: Psychiatry

## 2011-01-23 DIAGNOSIS — F319 Bipolar disorder, unspecified: Secondary | ICD-10-CM

## 2011-02-05 ENCOUNTER — Encounter (HOSPITAL_COMMUNITY): Payer: Self-pay

## 2011-02-12 ENCOUNTER — Encounter (HOSPITAL_COMMUNITY): Payer: Self-pay

## 2011-03-18 ENCOUNTER — Encounter (HOSPITAL_COMMUNITY): Payer: Self-pay | Admitting: Psychiatry

## 2011-03-18 ENCOUNTER — Ambulatory Visit (INDEPENDENT_AMBULATORY_CARE_PROVIDER_SITE_OTHER): Payer: Medicaid Other | Admitting: Psychiatry

## 2011-03-18 VITALS — BP 120/78 | Ht 67.0 in | Wt 150.0 lb

## 2011-03-18 DIAGNOSIS — F319 Bipolar disorder, unspecified: Secondary | ICD-10-CM

## 2011-03-18 NOTE — Progress Notes (Signed)
   [facility] Follow-up Outpatient Visit  Tonya Montgomery 1976/03/22   Subjective: The patient is a 35 year old female who has been followed by [facility] since April 2012. He is currently diagnosed with bipolar disorder. Current medications include Lamictal, Zoloft, Wellbutrin XL, and Xanax as needed. At last appointment Wellbutrin XL was added secondary to severe depression. Patient reports today that she's feeling somewhat better. She's not as low she was. Her main concern today is her 65 year-old son he she feels has depression. He does seem to isolate a lot and has a low mood. He is diagnosed with ADHD. He does not communicate well with her and does not express himself to say how he is doing. Older son he lives in a group home continues to have disruptive behavior. It does not look that he is coming home permanently anytime soon. Vision still has discord with her boyfriend. They're not really talking much at this point. Patient does have more energy during the day with the Wellbutrin. She's not sleeping as much. Mood is brighter.  There were no vitals filed for this visit.  Mental Status Examination  Appearance: Tonya Montgomery dressed Alert: Yes Attention: good  Cooperative: Yes Eye Contact: Good Speech: Regular rate rhythm and volume Psychomotor Activity: Normal Memory/Concentration: Intact Oriented: person, place, time/date and situation Mood: Euthymic Affect: Restricted Thought Processes and Associations: Logical Fund of Knowledge: Fair Thought Content: No suicidal or homicidal thoughts Insight: Fair Judgement: Fair  Diagnosis: Bipolar disorder  Treatment Plan: This point we'll not change any medication. I will see her back in 3 months. We will try to get her 70-year-old son set up to see Serafina Mitchell for therapy.  Jamse Mead, MD

## 2011-03-24 NOTE — Progress Notes (Signed)
Summary: POSSIBLE SINUS INFECTION rm 5   Vital Signs:  Patient Profile:   35 Years Old Female CC:      sore throat, cough x 1 day Height:     67 inches Weight:      152.50 pounds O2 Sat:      97 % O2 treatment:    Room Air Temp:     98.7 degrees F oral Pulse rate:   110 / minute Resp:     16 per minute BP sitting:   117 / 81  (left arm) Cuff size:   regular  Vitals Entered By: Clemens Catholic LPN (October 01, 2010 4:37 PM)                  Updated Prior Medication List: ZOLOFT 50 MG TABS (SERTRALINE HCL) 1 tab by mouth once daily XANAX 1 MG TABS (ALPRAZOLAM) as needed CELEBREX 100 MG CAPS (CELECOXIB)  LAMICTAL 100 MG TABS (LAMOTRIGINE)  LAMISIL 250 MG TABS (TERBINAFINE HCL)  FLEXERIL 10 MG TABS (CYCLOBENZAPRINE HCL)   Current Allergies (reviewed today): No known allergies History of Present Illness History from: patient Chief Complaint: sore throat, cough x 1 day History of Present Illness: 35 Years Old Female complains of onset of cold symptoms for 1 days.  KRIMSON has been using no OTC meds which is helping a little bit. ++ sore throat +cough No pleuritic pain No wheezing +nasal congestion + post-nasal drainage + R sided sinus pain/pressure No chest congestion No itchy/red eyes No earache No hemoptysis No SOB +chills/sweats No fever No nausea No vomiting No abdominal pain No diarrhea No skin rashes No fatigue + myalgias No headache   REVIEW OF SYSTEMS Constitutional Symptoms       Complains of night sweats.     Denies fever, chills, weight loss, weight gain, and fatigue.  Eyes       Denies change in vision, eye pain, eye discharge, glasses, contact lenses, and eye surgery. Ear/Nose/Throat/Mouth       Complains of sinus problems and sore throat.      Denies hearing loss/aids, change in hearing, ear pain, ear discharge, dizziness, frequent runny nose, frequent nose bleeds, hoarseness, and tooth pain or bleeding.  Respiratory       Complains of dry  cough.      Denies productive cough, wheezing, shortness of breath, asthma, bronchitis, and emphysema/COPD.  Cardiovascular       Denies murmurs, chest pain, and tires easily with exhertion.    Gastrointestinal       Denies stomach pain, nausea/vomiting, diarrhea, constipation, blood in bowel movements, and indigestion. Genitourniary       Denies painful urination, kidney stones, and loss of urinary control. Neurological       Complains of weakness.      Denies headaches, loss of or changes in sensation, numbness, tngling, tremors, paralysis, seizures, and fainting/blackouts. Musculoskeletal       Denies muscle pain, joint pain, joint stiffness, decreased range of motion, redness, swelling, muscle weakness, and gout.  Skin       Denies bruising, unusual mles/lumps or sores, and hair/skin or nail changes.  Psych       Denies mood changes, temper/anger issues, anxiety/stress, speech problems, depression, and sleep problems. Other Comments: pt c/o sore throat, cough, fatigue and sinus pressure x 1 day. no fever. no OTC meds.   Past History:  Past Medical History: Depression fibromyalgia   Past Surgical History: Reviewed history from 03/18/2010 and no changes required. Wisdom  teeth removed Kidney Stones  Family History: Reviewed history from 03/18/2010 and no changes required. Family History Lung cancer  Social History: Reviewed history from 03/18/2010 and no changes required. Single Never Smoked Alcohol use-no Drug use-no Regular exercise-no Physical Exam General appearance: well developed, well nourished, no acute distress Ears: normal, no lesions or deformities Nasal: mucosa pink, nonedematous, no septal deviation, turbinates normal Oral/Pharynx: tongue normal, posterior pharynx without erythema or exudate Chest/Lungs: no rales, wheezes, or rhonchi bilateral, breath sounds equal without effort Heart: regular rate and  rhythm, no murmur MSE: oriented to time, place, and  person Assessment New Problems: UPPER RESPIRATORY INFECTION, ACUTE (ICD-465.9)   Plan New Medications/Changes: AMOXICILLIN 875 MG TABS (AMOXICILLIN) 1 by mouth two times a day for 7 days  #14 x 0, 10/01/2010, Hoyt Koch MD  New Orders: Est. Patient Level IV [16109] Rapid Strep [60454] Planning Comments:   1)  Hold on antibiotic.  This is likely viral and will last about 7-10 days. 2)  Use nasal saline solution (over the counter) at least 3 times a day. 3)  Use over the counter decongestants like Zyrtec-D every 12 hours as needed to help with congestion. 4)  Can take tylenol every 6 hours or motrin every 8 hours for pain or fever. 5)  Follow up with your primary doctor  if no improvement in 5-7 days, sooner if increasing pain, fever, or new symptoms.    The patient and/or caregiver has been counseled thoroughly with regard to medications prescribed including dosage, schedule, interactions, rationale for use, and possible side effects and they verbalize understanding.  Diagnoses and expected course of recovery discussed and will return if not improved as expected or if the condition worsens. Patient and/or caregiver verbalized understanding.  Prescriptions: AMOXICILLIN 875 MG TABS (AMOXICILLIN) 1 by mouth two times a day for 7 days  #14 x 0   Entered and Authorized by:   Hoyt Koch MD   Signed by:   Hoyt Koch MD on 10/01/2010   Method used:   Print then Give to Patient   RxID:   [id number]   Orders Added: 1)  Est. Patient Level IV [30865] 2)  Rapid Strep [[id number]]    Laboratory Results  Date/Time Received: October 01, 2010 4:50 PM  Date/Time Reported: October 01, 2010 4:51 PM   Other Tests  Rapid Strep: negative  Kit Test Internal QC: Negative   (Normal Range: Negative)

## 2011-03-26 ENCOUNTER — Other Ambulatory Visit (HOSPITAL_COMMUNITY): Payer: Self-pay | Admitting: Psychiatry

## 2011-03-26 DIAGNOSIS — F329 Major depressive disorder, single episode, unspecified: Secondary | ICD-10-CM

## 2011-04-02 ENCOUNTER — Encounter (HOSPITAL_COMMUNITY): Payer: Self-pay | Admitting: Psychology

## 2011-04-02 ENCOUNTER — Ambulatory Visit (INDEPENDENT_AMBULATORY_CARE_PROVIDER_SITE_OTHER): Payer: Medicaid Other | Admitting: Psychology

## 2011-04-02 DIAGNOSIS — F3132 Bipolar disorder, current episode depressed, moderate: Secondary | ICD-10-CM

## 2011-04-02 DIAGNOSIS — F4310 Post-traumatic stress disorder, unspecified: Secondary | ICD-10-CM
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
## 2011-04-02 NOTE — Patient Instructions (Signed)
1- Practice recognizing negative thinking.  When you notice a negative thought stop it and insert a positive one. 2- Write down a list of your positive attributes and bring back to session 3- Think about what goals you would like to address in therapy and come prepared to discuss at our next visit.

## 2011-04-10 ENCOUNTER — Ambulatory Visit (HOSPITAL_COMMUNITY): Payer: Self-pay | Admitting: Psychology

## 2011-04-24 ENCOUNTER — Ambulatory Visit (HOSPITAL_COMMUNITY): Payer: Self-pay | Admitting: Psychology

## 2011-05-22 ENCOUNTER — Telehealth (HOSPITAL_COMMUNITY): Payer: Self-pay

## 2011-05-22 DIAGNOSIS — F329 Major depressive disorder, single episode, unspecified: Secondary | ICD-10-CM

## 2011-05-22 MED ORDER — BUPROPION HCL ER (XL) 300 MG PO TB24: 300.0000 mg | ORAL_TABLET | ORAL | Status: DC

## 2011-05-22 NOTE — Telephone Encounter (Signed)
I will increase Wellbutrin XL to 300 mg daily. Prescription was sent to pharmacy.

## 2011-05-24 ENCOUNTER — Other Ambulatory Visit (HOSPITAL_COMMUNITY): Payer: Self-pay | Admitting: Psychiatry

## 2011-06-18 ENCOUNTER — Encounter (HOSPITAL_COMMUNITY): Payer: Self-pay | Admitting: Psychiatry

## 2011-06-18 ENCOUNTER — Ambulatory Visit (INDEPENDENT_AMBULATORY_CARE_PROVIDER_SITE_OTHER): Payer: Medicaid Other | Admitting: Psychiatry

## 2011-06-18 VITALS — BP 118/85 | Ht 67.0 in | Wt 150.0 lb

## 2011-06-18 DIAGNOSIS — F411 Generalized anxiety disorder: Secondary | ICD-10-CM

## 2011-06-18 DIAGNOSIS — F329 Major depressive disorder, single episode, unspecified: Secondary | ICD-10-CM

## 2011-06-18 MED ORDER — SERTRALINE HCL 50 MG PO TABS: 75.0000 mg | ORAL_TABLET | Freq: Every day | ORAL | Status: DC

## 2011-06-18 MED ORDER — LAMOTRIGINE 200 MG PO TABS: 200.0000 mg | ORAL_TABLET | Freq: Every day | ORAL | Status: DC

## 2011-06-18 MED ORDER — ALPRAZOLAM 1 MG PO TABS: 1.0000 mg | ORAL_TABLET | Freq: Three times a day (TID) | ORAL | Status: DC | PRN

## 2011-06-18 MED ORDER — BUPROPION HCL ER (XL) 300 MG PO TB24: 300.0000 mg | ORAL_TABLET | ORAL | Status: DC

## 2011-06-18 NOTE — Progress Notes (Signed)
   [facility] Follow-up Outpatient Visit  CAYLEY PESTER 18-Dec-1975   Subjective: The patient is a 36 year old female who has been followed by [facility] since April 2012. She is currently diagnosed with bipolar disorder. Current medications include Lamictal, Zoloft, Wellbutrin XL, and Xanax as needed. We did not make any changes at her last appointment. She did call on January 31. At that time she stated she was not doing well and we increased her Wellbutrin XL to 300 mg daily. The patient reports today that she's not motivated. She sleeping a lot. She did have a neurological workup which was negative. They did find that her thyroid was low. She has started Synthroid and has been on it for approximately 10 days. The patient reports self-esteem issues. She scared to talk to people. She's been socially isolating. She has been using Facebook, but has not been going out at all. We have discussed going up on Zoloft in the past. Because the patient currently takes Savella and I am concerned about serotonin syndrome we have never done this.  Filed Vitals:   06/18/11 0955  BP: 118/85    Mental Status Examination  Appearance: [name] dressed Alert: Yes Attention: good  Cooperative: Yes Eye Contact: Good Speech: Regular rate rhythm and volume Psychomotor Activity: Normal Memory/Concentration: Intact Oriented: person, place, time/date and situation Mood: Euthymic Affect: Restricted Thought Processes and Associations: Logical Fund of Knowledge: Fair Thought Content: No suicidal or homicidal thoughts Insight: Fair Judgement: Fair  Diagnosis: Bipolar disorder  Treatment Plan: We will increase the Zoloft to 75 mg daily. I have provided the patient with handout regarding serotonin syndrome signs and symptoms to look for. I will see her back in 6 weeks. Patient to call with any concerns.   Jamse Mead, MD

## 2011-06-22 ENCOUNTER — Encounter (HOSPITAL_BASED_OUTPATIENT_CLINIC_OR_DEPARTMENT_OTHER): Payer: Self-pay | Admitting: *Deleted

## 2011-06-22 ENCOUNTER — Other Ambulatory Visit: Payer: Self-pay

## 2011-06-22 ENCOUNTER — Emergency Department (HOSPITAL_BASED_OUTPATIENT_CLINIC_OR_DEPARTMENT_OTHER)
Admission: EM | Admit: 2011-06-22 | Discharge: 2011-06-22 | Disposition: A | Discharge status: Home / Self Care | Payer: Medicaid Other | Attending: Emergency Medicine | Admitting: Emergency Medicine

## 2011-06-22 ENCOUNTER — Emergency Department (INDEPENDENT_AMBULATORY_CARE_PROVIDER_SITE_OTHER): Payer: Medicaid Other

## 2011-06-22 DIAGNOSIS — J45901 Unspecified asthma with (acute) exacerbation: Secondary | ICD-10-CM | POA: Insufficient documentation

## 2011-06-22 DIAGNOSIS — M4120 Other idiopathic scoliosis, site unspecified: Secondary | ICD-10-CM

## 2011-06-22 DIAGNOSIS — R0609 Other forms of dyspnea: Secondary | ICD-10-CM

## 2011-06-22 DIAGNOSIS — R000 Tachycardia, unspecified: Secondary | ICD-10-CM | POA: Insufficient documentation

## 2011-06-22 DIAGNOSIS — R079 Chest pain, unspecified: Secondary | ICD-10-CM

## 2011-06-22 DIAGNOSIS — R0602 Shortness of breath: Secondary | ICD-10-CM

## 2011-06-22 DIAGNOSIS — Z79899 Other long term (current) drug therapy: Secondary | ICD-10-CM | POA: Insufficient documentation

## 2011-06-22 DIAGNOSIS — F319 Bipolar disorder, unspecified: Secondary | ICD-10-CM | POA: Insufficient documentation

## 2011-06-22 LAB — CBC
Hemoglobin: 13.6 g/dL (ref 12.0–15.0)
MCHC: 34.9 g/dL (ref 30.0–36.0)
Platelets: 221 10*3/uL (ref 150–400)
RBC: 4.61 MIL/uL (ref 3.87–5.11)

## 2011-06-22 LAB — DIFFERENTIAL
Basophils Relative: 0 % (ref 0–1)
Lymphs Abs: 0.6 10*3/uL — ABNORMAL LOW (ref 0.7–4.0)
Monocytes Relative: 1 % — ABNORMAL LOW (ref 3–12)
Neutro Abs: 12.6 10*3/uL — ABNORMAL HIGH (ref 1.7–7.7)
Neutrophils Relative %: 94 % — ABNORMAL HIGH (ref 43–77)

## 2011-06-22 LAB — BASIC METABOLIC PANEL
BUN: 17 mg/dL (ref 6–23)
Chloride: 107 mEq/L (ref 96–112)
GFR calc Af Amer: >90 mL/min (ref >90)
Potassium: 3.4 mEq/L — ABNORMAL LOW (ref 3.5–5.1)
Sodium: 142 mEq/L (ref 135–145)

## 2011-06-22 LAB — D-DIMER, QUANTITATIVE: D-Dimer, Quant: <0.22 ug/mL-FEU (ref 0.00–0.48)

## 2011-06-22 LAB — PREGNANCY, URINE: Preg Test, Ur: NEGATIVE

## 2011-06-22 LAB — TROPONIN I: Troponin I: <0.3 ng/mL (ref <0.30)

## 2011-06-22 MED ORDER — METHYLPREDNISOLONE SODIUM SUCC 125 MG IJ SOLR
INTRAMUSCULAR | Status: AC
  Administered 2011-06-22: 17:00:00
  Filled 2011-06-22: qty 2

## 2011-06-22 MED ORDER — LORAZEPAM 2 MG/ML IJ SOLN
1.0000 mg | Freq: Once | INTRAMUSCULAR | Status: AC
  Administered 2011-06-22: 1 mg via INTRAVENOUS
  Filled 2011-06-22: qty 1

## 2011-06-22 MED ORDER — ALBUTEROL SULFATE HFA 108 (90 BASE) MCG/ACT IN AERS: 1.0000 | INHALATION_SPRAY | RESPIRATORY_TRACT | Status: DC | PRN

## 2011-06-22 MED ORDER — IPRATROPIUM BROMIDE 0.02 % IN SOLN
RESPIRATORY_TRACT | Status: AC
  Administered 2011-06-22: 18:00:00
  Filled 2011-06-22: qty 2.5

## 2011-06-22 MED ORDER — ALBUTEROL SULFATE (5 MG/ML) 0.5% IN NEBU
INHALATION_SOLUTION | RESPIRATORY_TRACT | Status: AC
  Administered 2011-06-22: 18:00:00
  Filled 2011-06-22: qty 2

## 2011-06-22 MED ORDER — SODIUM CHLORIDE 0.9 % IV SOLN
INTRAVENOUS | Status: DC
  Administered 2011-06-22: 21:00:00 via INTRAVENOUS

## 2011-06-22 MED ORDER — SODIUM CHLORIDE 0.9 % IV BOLUS (SEPSIS)
1000.0000 mL | Freq: Once | INTRAVENOUS | Status: AC
  Administered 2011-06-22: 1000 mL via INTRAVENOUS

## 2011-06-22 MED ORDER — ASPIRIN 81 MG PO CHEW
324.0000 mg | CHEWABLE_TABLET | Freq: Once | ORAL | Status: AC
  Administered 2011-06-22: 324 mg via ORAL
  Filled 2011-06-22: qty 1

## 2011-06-22 MED ORDER — METOPROLOL TARTRATE 1 MG/ML IV SOLN
5.0000 mg | Freq: Once | INTRAVENOUS | Status: AC
  Administered 2011-06-22: 5 mg via INTRAVENOUS
  Filled 2011-06-22: qty 5

## 2011-06-22 MED ORDER — ALBUTEROL SULFATE HFA 108 (90 BASE) MCG/ACT IN AERS
1.0000 | INHALATION_SPRAY | RESPIRATORY_TRACT | Status: DC | PRN
  Filled 2011-06-22: qty 6.7

## 2011-06-22 NOTE — ED Notes (Signed)
RT assessed patient upon arrival. She was getting a neb tx from EMS. BBS clear, slightly diminished, SAT 100% on RA. Patient appears to be very anxious. RT will continue to monitor.

## 2011-06-22 NOTE — ED Notes (Signed)
D/c vitals given to EDP Fredricka Bonine- orders received for metoprolol and iv fluids

## 2011-06-22 NOTE — ED Notes (Signed)
Patient transported to X-ray 

## 2011-06-22 NOTE — Discharge Instructions (Signed)
Asthma Attack Prevention HOW CAN ASTHMA BE PREVENTED? Currently, there is no way to prevent asthma from starting. However, you can take steps to control the disease and prevent its symptoms after you have been diagnosed. Learn about your asthma and how to control it. Take an active role to control your asthma by working with your caregiver to create and follow an asthma action plan. An asthma action plan guides you in taking your medicines properly, avoiding factors that make your asthma worse, tracking your level of asthma control, responding to worsening asthma, and seeking emergency care when needed. To track your asthma, keep records of your symptoms, check your peak flow number using a peak flow meter (handheld device that shows how well air moves out of your lungs), and get regular asthma checkups.  Other ways to prevent asthma attacks include:  Use medicines as your caregiver directs.   Identify and avoid things that make your asthma worse (as much as you can).   Keep track of your asthma symptoms and level of control.   Get regular checkups for your asthma.   With your caregiver, write a detailed plan for taking medicines and managing an asthma attack. Then be sure to follow your action plan. Asthma is an ongoing condition that needs regular monitoring and treatment.   Identify and avoid asthma triggers. A number of outdoor allergens and irritants (pollen, mold, cold air, air pollution) can trigger asthma attacks. Find out what causes or makes your asthma worse, and take steps to avoid those triggers (see below).   Monitor your breathing. Learn to recognize warning signs of an attack, such as slight coughing, wheezing or shortness of breath. However, your lung function may already decrease before you notice any signs or symptoms, so regularly measure and record your peak airflow with a home peak flow meter.   Identify and treat attacks early. If you act quickly, you're less likely to have  a severe attack. You will also need less medicine to control your symptoms. When your peak flow measurements decrease and alert you to an upcoming attack, take your medicine as instructed, and immediately stop any activity that may have triggered the attack. If your symptoms do not improve, get medical help.   Pay attention to increasing quick-relief inhaler use. If you find yourself relying on your quick-relief inhaler (such as albuterol), your asthma is not under control. See your caregiver about adjusting your treatment.  IDENTIFY AND CONTROL FACTORS THAT MAKE YOUR ASTHMA WORSE A number of common things can set off or make your asthma symptoms worse (asthma triggers). Keep track of your asthma symptoms for several weeks, detailing all the environmental and emotional factors that are linked with your asthma. When you have an asthma attack, go back to your asthma diary to see which factor, or combination of factors, might have contributed to it. Once you know what these factors are, you can take steps to control many of them.  Allergies: If you have allergies and asthma, it is important to take asthma prevention steps at home. Asthma attacks (worsening of asthma symptoms) can be triggered by allergies, which can cause temporary increased inflammation of your airways. Minimizing contact with the substance to which you are allergic will help prevent an asthma attack. Animal Dander:   Some people are allergic to the flakes of skin or dried saliva from animals with fur or feathers. Keep these pets out of your home.   If you can't keep a pet outdoors, keep the   pet out of your bedroom and other sleeping areas at all times, and keep the door closed.   Remove carpets and furniture covered with cloth from your home. If that is not possible, keep the pet away from fabric-covered furniture and carpets.  Dust Mites:  Many people with asthma are allergic to dust mites. Dust mites are tiny bugs that are found in  every home, in mattresses, pillows, carpets, fabric-covered furniture, bedcovers, clothes, stuffed toys, fabric, and other fabric-covered items.   Cover your mattress in a special dust-proof cover.   Cover your pillow in a special dust-proof cover, or wash the pillow each week in hot water. Water must be hotter than 130 F to kill dust mites. Cold or warm water used with detergent and bleach can also be effective.   Wash the sheets and blankets on your bed each week in hot water.   Try not to sleep or lie on cloth-covered cushions.   Call ahead when traveling and ask for a smoke-free hotel room. Bring your own bedding and pillows, in case the hotel only supplies feather pillows and down comforters, which may contain dust mites and cause asthma symptoms.   Remove carpets from your bedroom and those laid on concrete, if you can.   Keep stuffed toys out of the bed, or wash the toys weekly in hot water or cooler water with detergent and bleach.  Cockroaches:  Many people with asthma are allergic to the droppings and remains of cockroaches.   Keep food and garbage in closed containers. Never leave food out.   Use poison baits, traps, powders, gels, or paste (for example, boric acid).   If a spray is used to kill cockroaches, stay out of the room until the odor goes away.  Indoor Mold:  Fix leaky faucets, pipes, or other sources of water that have mold around them.   Clean moldy surfaces with a cleaner that has bleach in it.  Pollen and Outdoor Mold:  When pollen or mold spore counts are high, try to keep your windows closed.   Stay indoors with windows closed from late morning to afternoon, if you can. Pollen and some mold spore counts are highest at that time.   Ask your caregiver whether you need to take or increase anti-inflammatory medicine before your allergy season starts.  Irritants:   Tobacco smoke is an irritant. If you smoke, ask your caregiver how you can quit. Ask family  members to quit smoking, too. Do not allow smoking in your home or car.   If possible, do not use a wood-burning stove, kerosene heater, or fireplace. Minimize exposure to all sources of smoke, including incense, candles, fires, and fireworks.   Try to stay away from strong odors and sprays, such as perfume, talcum powder, hair spray, and paints.   Decrease humidity in your home and use an indoor air cleaning device. Reduce indoor humidity to below 60 percent. Dehumidifiers or central air conditioners can do this.   Try to have someone else vacuum for you once or twice a week, if you can. Stay out of rooms while they are being vacuumed and for a short while afterward.   If you vacuum, use a dust mask from a hardware store, a double-layered or microfilter vacuum cleaner bag, or a vacuum cleaner with a HEPA filter.   Sulfites in foods and beverages can be irritants. Do not drink beer or wine, or eat dried fruit, processed potatoes, or shrimp if they cause asthma   symptoms.   Cold air can trigger an asthma attack. Cover your nose and mouth with a scarf on cold or windy days.   Several health conditions can make asthma more difficult to manage, including runny nose, sinus infections, reflux disease, psychological stress, and sleep apnea. Your caregiver will treat these conditions, as well.   Avoid close contact with people who have a cold or the flu, since your asthma symptoms may get worse if you catch the infection from them. Wash your hands thoroughly after touching items that may have been handled by people with a respiratory infection.   Get a flu shot every year to protect against the flu virus, which often makes asthma worse for days or weeks. Also get a pneumonia shot once every five to 10 years.  Drugs:  Aspirin and other painkillers can cause asthma attacks. 10% to 20% of people with asthma have sensitivity to aspirin or a group of painkillers called non-steroidal anti-inflammatory drugs  (NSAIDS), such as ibuprofen and naproxen. These drugs are used to treat pain and reduce fevers. Asthma attacks caused by any of these medicines can be severe and even fatal. These drugs must be avoided in people who have known aspirin sensitive asthma. Products with acetaminophen are considered safe for people who have asthma. It is important that people with aspirin sensitivity read labels of all over-the-counter drugs used to treat pain, colds, coughs, and fever.   Beta blockers and ACE inhibitors are other drugs which you should discuss with your caregiver, in relation to your asthma.  ALLERGY SKIN TESTING  Ask your asthma caregiver about allergy skin testing or blood testing (RAST test) to identify the allergens to which you are sensitive. If you are found to have allergies, allergy shots (immunotherapy) for asthma may help prevent future allergies and asthma. With allergy shots, small doses of allergens (substances to which you are allergic) are injected under your skin on a regular schedule. Over a period of time, your body may become used to the allergen and less responsive with asthma symptoms. You can also take measures to minimize your exposure to those allergens. EXERCISE  If you have exercise-induced asthma, or are planning vigorous exercise, or exercise in cold, humid, or dry environments, prevent exercise-induced asthma by following your caregiver's advice regarding asthma treatment before exercising. Document Released: 03/26/2009 Document Revised: 03/27/2011 Document Reviewed: 03/26/2009 [facility] Patient Information 2012 [place], [place].Asthma, Acute Bronchospasm Your exam shows you have asthma, or acute bronchospasm that acts like asthma. Bronchospasm means your air passages become narrowed. These conditions are due to inflammation and airway spasm that cause narrowing of the bronchial tubes in the lungs. This causes you to have wheezing and shortness of breath. CAUSES  Respiratory  infections and allergies most often bring on these attacks. Smoking, air pollution, cold air, emotional upsets, and vigorous exercise can also bring them on.  TREATMENT   Treatment is aimed at making the narrowed airways larger. Mild asthma/bronchospasm is usually controlled with inhaled medicines. Albuterol is a common medicine that you breathe in to open spastic or narrowed airways. Some trade names for albuterol are Ventolin or Proventil. Steroid medicine is also used to reduce the inflammation when an attack is moderate or severe. Antibiotics (medications used to kill germs) are only used if a bacterial infection is present.   If you are pregnant and need to use Albuterol (Ventolin or Proventil), you can expect the baby to move more than usual shortly after the medicine is used.  HOME CARE  INSTRUCTIONS   Rest.   Drink plenty of liquids. This helps the mucus to remain thin and easily coughed up. Do not use caffeine or alcohol.   Do not smoke. Avoid being exposed to second-hand smoke.   You play a critical role in keeping yourself in good health. Avoid exposure to things that cause you to wheeze. Avoid exposure to things that cause you to have breathing problems. Keep your medications up-to-date and available. Carefully follow your doctor's treatment plan.   When pollen or pollution is bad, keep windows closed and use an air conditioner go to places with air conditioning. If you are allergic to furry pets or birds, find new homes for them or keep them outside.   Take your medicine exactly as prescribed.   Asthma requires careful medical attention. See your caregiver for follow-up as advised. If you are more than [redacted] weeks pregnant and you were prescribed any new medications, let your Obstetrician know about the visit and how you are doing. Arrange a recheck.  SEEK IMMEDIATE MEDICAL CARE IF:   You are getting worse.   You have trouble breathing. If severe, call 911.   You develop chest  pain or discomfort.   You are throwing up or not drinking fluids.   You are not getting better within 24 hours.   You are coughing up yellow, green, brown, or bloody sputum.   You develop a fever over 102 F (38.9 C).   You have trouble swallowing.  MAKE SURE YOU:   Understand these instructions.   Will watch your condition.   Will get help right away if you are not doing well or get worse.  Document Released: 07/23/2006 Document Revised: 03/27/2011 Document Reviewed: 03/22/2007 [facility] Patient Information 2012 [place], [place].

## 2011-06-22 NOTE — ED Notes (Signed)
Albuterol inhaler and rx given for home use- family present to drive

## 2011-06-22 NOTE — ED Provider Notes (Signed)
History   This chart was scribed for Felisa Bonier, MD by Melba Coon. The patient was seen in room MH04/MH04 and the patient's care was started at 6:28PM.    CSN: [id number]  Arrival date & time 06/22/11  1739   First MD Initiated Contact with Patient 06/22/11 1744      Chief Complaint  Patient presents with  . Shortness of Breath    (Consider location/radiation/quality/duration/timing/severity/associated sxs/prior treatment) HPI Tonya Montgomery is a 36 y.o. female who is presented by EMS to the Emergency Department complaining of persistent, moderate to severe SOB with an onset today. EMS transported pt from work. Pt states she was very hot at work which triggered her asthma and started turning blue. EMS gave pt 10mg  albuterol/0.5mg  atrovent and 125mg  solumedrol prior to arrival. Other vitals PTA include: HR: 120, BP: 140/84, O2 100% on aerosol mask, resp rate: 24. No HA, CP, wheezing, abd pain, n/v/d, or extremity pain. Pt feels a lot better at time of exam. Allergic to Voltaren. No other pertinent medical symptoms.  Past Medical History  Diagnosis Date  . Anxiety   . Bipolar disorder   . Carpal tunnel syndrome   . Fibromyalgia   . Asthma     No past surgical history on file.  Family History  Problem Relation Age of Onset  . Depression Mother   . Depression Maternal Aunt   . ADD / ADHD Son   . Anxiety disorder Son   . ADD / ADHD Son     History  Substance Use Topics  . Smoking status: Never Smoker   . Smokeless tobacco: Never Used  . Alcohol Use: 0.0 oz/week     Per patient report one  drink of wine every 2-3 months if that.    OB History    Grav Para Term Preterm Abortions TAB SAB Ect Mult Living                  Review of Systems 10 Systems reviewed and are negative for acute change except as noted in the HPI.  Allergies  Voltaren  Home Medications   Current Outpatient Rx  Name Route Sig Dispense Refill  . ALPRAZOLAM 1 MG PO TABS Oral Take  1 tablet (1 mg total) by mouth 3 (three) times daily as needed. 90 tablet 1  . BUPROPION HCL ER (XL) 300 MG PO TB24 Oral Take 1 tablet (300 mg total) by mouth every morning. 30 tablet 5  . CYCLOBENZAPRINE HCL 10 MG PO TABS Oral Take 10 mg by mouth 3 (three) times daily as needed. Per Irene Limbo. Reports tries to only take one time per day.     Marland Kitchen LAMOTRIGINE 200 MG PO TABS Oral Take 1 tablet (200 mg total) by mouth daily. 30 tablet 5  . MILNACIPRAN HCL 100 MG PO TABS Oral Take 100 mg by mouth 2 (two) times daily. Per Irene Limbo     . SERTRALINE HCL 50 MG PO TABS Oral Take 1.5 tablets (75 mg total) by mouth daily. 45 tablet 5    BP 138/82  Pulse 114  Temp(Src) 97.8 F (36.6 C) (Oral)  Resp 22  SpO2 100%  Physical Exam  Nursing note and vitals reviewed. Constitutional: She appears well-developed and well-nourished.       Awake, alert, nontoxic appearance.  HENT:  Head: Normocephalic and atraumatic.  Right Ear: External ear normal.  Left Ear: External ear normal.  Mouth/Throat: Oropharynx is clear and moist.  Eyes: Conjunctivae  and EOM are normal. Pupils are equal, round, and reactive to light. Right eye exhibits no discharge. Left eye exhibits no discharge.  Neck: Normal range of motion. Neck supple.       No jugular vein distension.  Cardiovascular: Normal rate, regular rhythm, normal heart sounds and intact distal pulses.  Exam reveals no gallop and no friction rub.   No murmur heard. Pulmonary/Chest: Effort normal and breath sounds normal. No respiratory distress. She has no wheezes. She has no rales. She exhibits no tenderness.       Nml movement of chest wall; no deformity.  Abdominal: Soft. Bowel sounds are normal. She exhibits no distension. There is no tenderness. There is no rebound and no guarding.  Musculoskeletal: She exhibits no edema (Calves and LEs) and no tenderness (Calves and LEs).       Baseline ROM, no obvious new focal weakness.  Neurological:         Mental status and motor strength appears baseline for patient and situation.  Skin: Skin is warm and dry. No rash noted.  Psychiatric: She has a normal mood and affect. Her behavior is normal.    ED Course  Procedures (including critical care time)    Date: 06/22/2011  Rate: 115  Rhythm: sinus tachycardia  QRS Axis: normal  Intervals: normal  ST/T Wave abnormalities: normal  Conduction Disutrbances:none  Narrative Interpretation: Sinus tachycardia, otherwise non-provocative EKG  Old EKG Reviewed: none available   Ezreal Turay D    DIAGNOSTIC STUDIES: Oxygen Saturation is 100% on room air, normal by my interpretation.    COORDINATION OF CARE:  6:31PM - EDMD will order CXR, EKG, and blood clot tests   Labs Reviewed - No data to display No results found.   No diagnosis found.    MDM  Acute asthma attack, anxiety, Musculoskeletal chest pain, costochondritis, GERD, Gastrointestinal Chest Pain, Pleuritic Chest Pain, Pneumonia, Pneumothorax, Pulmonary Embolism, Esophageal Spasm, Arrhythmia considered among other potential etiologies in the patient's differential diagnosis. Myocardial infarction or acute coronary syndrome is thought much less likely in this otherwise low-risk patient.  8:18 PM Patient is at this time:, Awake, alert, oriented appropriately, in no apparent distress, reporting relief of her symptoms, breathing easily with no wheezing or respiratory distress. She is having no chest discomfort. She does have a persistent mild tachycardia, sinus tachycardia, with normal blood pressure and no fever, with normal oxygenation. The patient is at low risk for pulmonary embolism and by Wells criteria, the negative d-dimer excludes pulmonary embolism from the differential diagnosis with good confidence. She is not profoundly anemic, has no apparent electrolyte abnormalities, has normal electrocardiographic conduction, has no apparent pneumonia, and no sign of  myocardial infarction or ischemia (she is very low risk for coronary artery disease). The patient will be discharged home with an inhaler for treatment if symptoms recur. The patient has had an apparent asthma attack that has resolved.       Felisa Bonier, MD 06/22/11 2020

## 2011-06-22 NOTE — ED Notes (Signed)
EMS transport from work- states she got hot at work and triggered asthma- received 10mg  albuterol/0.5mg  atrovent and 125mg  solumedrol pta- piv 20g left ac- HR 120, 140/84, O2 100% on aerosol mask, RR 24

## 2011-07-08 ENCOUNTER — Emergency Department
Admission: EM | Admit: 2011-07-08 | Discharge: 2011-07-08 | Disposition: A | Discharge status: Home / Self Care | Payer: Medicaid Other | Source: Home / Self Care | Attending: Emergency Medicine | Admitting: Emergency Medicine

## 2011-07-08 DIAGNOSIS — N390 Urinary tract infection, site not specified: Secondary | ICD-10-CM

## 2011-07-08 LAB — POCT URINALYSIS DIP (MANUAL ENTRY)
Bilirubin, UA: NEGATIVE
Glucose, UA: NEGATIVE
Nitrite, UA: NEGATIVE
Urobilinogen, UA: 0.2 (ref 0–1)

## 2011-07-08 MED ORDER — CIPROFLOXACIN HCL 250 MG PO TABS: 250.0000 mg | ORAL_TABLET | Freq: Two times a day (BID) | ORAL | Status: AC

## 2011-07-08 NOTE — ED Provider Notes (Signed)
History     CSN: [id number]  Arrival date & time 07/08/11  1642   First MD Initiated Contact with Patient 07/08/11 1652      Chief Complaint  Patient presents with  . Dysuria    x 1 day    (Consider location/radiation/quality/duration/timing/severity/associated sxs/prior treatment) HPI Catherine is a 36 y.o. female who presents today with UTI symptoms for 1-2 days.  However, she has had intermittent symptoms for 2 months.  Placed on Amoxicillin, Zpak, and Keflex on 3 different occasions.  Tends to get a little better, but never completely. + dysuria + feeling of incomplete voiding No frequency No urgency +/- hematuria (she has an IUD and has occasional vaginal spotting) No vaginal discharge No fever/chills No lower abdominal pain No back pain No fatigue    Past Medical History  Diagnosis Date  . Anxiety   . Bipolar disorder   . Carpal tunnel syndrome   . Fibromyalgia   . Asthma     History reviewed. No pertinent past surgical history.  Family History  Problem Relation Age of Onset  . Depression Mother   . Cancer Mother     lung  . Depression Maternal Aunt   . ADD / ADHD Son   . Anxiety disorder Son   . ADD / ADHD Son     History  Substance Use Topics  . Smoking status: Never Smoker   . Smokeless tobacco: Never Used  . Alcohol Use: 0.0 oz/week     Per patient report one  drink of wine every 2-3 months if that.    OB History    Grav Para Term Preterm Abortions TAB SAB Ect Mult Living                  Review of Systems  All other systems reviewed and are negative.    Allergies  Review of patient's allergies indicates no known allergies.  Home Medications   Current Outpatient Rx  Name Route Sig Dispense Refill  . ALBUTEROL SULFATE HFA 108 (90 BASE) MCG/ACT IN AERS Inhalation Inhale 1-2 puffs into the lungs every 4 (four) hours as needed for wheezing or shortness of breath. 1 Inhaler 0  . ALPRAZOLAM 1 MG PO TABS Oral Take 1 mg by mouth 3 (three)  times daily as needed. For anxiety    . BIOTIN 1000 MCG PO TABS Oral Take 1,000 mcg by mouth daily.    . BUPROPION HCL ER (XL) 300 MG PO TB24 Oral Take 1 tablet (300 mg total) by mouth every morning. 30 tablet 5  . CYCLOBENZAPRINE HCL 10 MG PO TABS Oral Take 10 mg by mouth 3 (three) times daily as needed. Per Irene Limbo. Reports tries to only take one time per day.    Marland Kitchen LAMOTRIGINE 200 MG PO TABS Oral Take 1 tablet (200 mg total) by mouth daily. 30 tablet 5  . MILNACIPRAN HCL 100 MG PO TABS Oral Take 100 mg by mouth 2 (two) times daily. Per Irene Limbo     . SERTRALINE HCL 50 MG PO TABS Oral Take 50 mg by mouth daily.    Marland Kitchen CIPROFLOXACIN HCL 250 MG PO TABS Oral Take 1 tablet (250 mg total) by mouth 2 (two) times daily. 20 tablet 0    BP 126/73  Pulse 116  Temp(Src) 98 F (36.7 C) (Oral)  Resp 16  Ht 5\' 7"  (1.702 m)  Wt 151 lb (68.493 kg)  BMI 23.65 kg/m2  SpO2 100%  Physical Exam  Nursing note and vitals reviewed. Constitutional: She is oriented to person, place, and time. She appears well-developed and well-nourished.  HENT:  Head: Normocephalic and atraumatic.  Eyes: No scleral icterus.  Neck: Neck supple.  Cardiovascular: Regular rhythm and normal heart sounds.   Pulmonary/Chest: Effort normal and breath sounds normal. No respiratory distress.  Abdominal: Soft. Normal appearance and bowel sounds are normal. She exhibits no mass. There is no rebound, no guarding and no CVA tenderness.  Neurological: She is alert and oriented to person, place, and time.  Skin: Skin is warm and dry.  Psychiatric: She has a normal mood and affect. Her speech is normal.    ED Course  Procedures (including critical care time)  Labs Reviewed - No data to display No results found.   1. Urinary tract infection, site not specified       MDM  1) Take the prescribed antibiotic as directed.  A urine culture is pending.  Because she is having recurrent symptoms, referral to  urology would be appropriate if symptoms don't improve or if she has worsening symptoms again. 2) A urinalysis was done in clinic.  A urine culture is pending. 3) Follow up with your PCP or urologist if not improving or if worsening symptoms.   Marlaine Hind, MD 07/08/11 [phone]

## 2011-07-08 NOTE — ED Notes (Signed)
Patient presents with on going UTI x 2 months. She has been treated with cephalexin, amoxicillin and zpack. She has had burning x 1 day and feels like she can not empty bladder.

## 2011-07-09 ENCOUNTER — Ambulatory Visit (INDEPENDENT_AMBULATORY_CARE_PROVIDER_SITE_OTHER): Payer: Medicaid Other | Admitting: Psychiatry

## 2011-07-09 ENCOUNTER — Encounter (HOSPITAL_COMMUNITY): Payer: Self-pay | Admitting: Psychiatry

## 2011-07-09 VITALS — BP 102/62 | Ht 67.0 in | Wt 150.0 lb

## 2011-07-09 DIAGNOSIS — F411 Generalized anxiety disorder: Secondary | ICD-10-CM

## 2011-07-09 DIAGNOSIS — F319 Bipolar disorder, unspecified: Secondary | ICD-10-CM

## 2011-07-09 MED ORDER — SERTRALINE HCL 100 MG PO TABS: 100.0000 mg | ORAL_TABLET | Freq: Every day | ORAL | Status: DC

## 2011-07-09 MED ORDER — ALPRAZOLAM 1 MG PO TBDP: 1.0000 mg | ORAL_TABLET | Freq: Every day | ORAL | Status: AC | PRN
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
## 2011-07-10 ENCOUNTER — Telehealth: Payer: Self-pay | Admitting: *Deleted

## 2011-07-10 LAB — URINE CULTURE: Colony Count: 35000

## 2011-07-14 ENCOUNTER — Telehealth (HOSPITAL_COMMUNITY): Payer: Self-pay

## 2011-07-14 NOTE — Telephone Encounter (Signed)
Spoke with PCP- will work pt up with PFT's secondary to asthma attack vs panic attack.  Will also drug test patient.

## 2011-07-30 ENCOUNTER — Ambulatory Visit (HOSPITAL_COMMUNITY): Payer: Self-pay | Admitting: Psychiatry

## 2011-08-12 ENCOUNTER — Other Ambulatory Visit (HOSPITAL_COMMUNITY): Payer: Self-pay | Admitting: Psychiatry

## 2011-08-12 MED ORDER — ALPRAZOLAM 0.5 MG PO TABS: 5.0000 mg | ORAL_TABLET | Freq: Two times a day (BID) | ORAL | Status: DC | PRN

## 2011-08-14 ENCOUNTER — Telehealth (HOSPITAL_COMMUNITY): Payer: Self-pay

## 2011-08-14 NOTE — Telephone Encounter (Signed)
OK 

## 2011-08-20 ENCOUNTER — Ambulatory Visit (INDEPENDENT_AMBULATORY_CARE_PROVIDER_SITE_OTHER): Payer: Medicaid Other | Admitting: Psychiatry

## 2011-08-20 ENCOUNTER — Encounter (HOSPITAL_COMMUNITY): Payer: Self-pay | Admitting: Psychiatry

## 2011-08-20 VITALS — BP 100/60 | Ht 67.0 in | Wt 146.0 lb

## 2011-08-20 DIAGNOSIS — F3189 Other bipolar disorder: Secondary | ICD-10-CM

## 2011-08-20 DIAGNOSIS — F411 Generalized anxiety disorder: Secondary | ICD-10-CM

## 2011-08-20 DIAGNOSIS — F3181 Bipolar II disorder: Secondary | ICD-10-CM

## 2011-08-20 MED ORDER — MIRTAZAPINE 15 MG PO TABS: 7.5000 mg | ORAL_TABLET | Freq: Every day | ORAL | Status: DC

## 2011-08-20 NOTE — Progress Notes (Signed)
   [facility] Behavioral Health Follow-up Outpatient Visit  Tonya Montgomery 1975/09/23   Subjective: The patient is a 36 year old female who has been followed by [facility] since April 2012. She is currently diagnosed with bipolar disorder. Current medications include Lamictal, Zoloft, Wellbutrin XL, and Xanax as needed. The patient is currently being worked up by pulmonary. She reports that she saw a pulmonologist yesterday. She also states that when she stands her O2 saturations appear to drop. The patient reports feeling "blah". She has no motivation. She wants to stay in bed all day. She does not sleep. She has no energy. She just feels depressed. Her no suicidal thoughts. The patient reports she supposed to have an MRI this week by pulmonary. The patient did have surgery 2 weeks ago secondary to frequent urinary tract infections. They dilated her urethra. She reports the recovery time was very difficult.spoken to her primary care Judeth Cornfield took a half. Judeth Cornfield is going to speak to pulmonary and see what is going on.  Filed Vitals:   08/20/11 1114  BP: 100/60    Mental Status Examination  Appearance: Tonya Montgomery dressed Alert: Yes Attention: good  Cooperative: Yes Eye Contact: Good Speech: Regular rate rhythm and volume Psychomotor Activity: Normal Memory/Concentration: Intact Oriented: person, place, time/date and situation Mood: Euthymic Affect: Restricted Thought Processes and Associations: Logical Fund of Knowledge: Fair Thought Content: No suicidal or homicidal thoughts Insight: Fair Judgement: Fair  Diagnosis: Bipolar disorder  Treatment Plan: I will continue her current medications. All at a low dose of Remeron to see if it helps with sleep at bedtime. I await an update from [place]. I will see the patient back in one month.   Jamse Mead, MD

## 2011-09-18 ENCOUNTER — Telehealth (HOSPITAL_COMMUNITY): Payer: Self-pay

## 2011-09-18 ENCOUNTER — Ambulatory Visit (HOSPITAL_COMMUNITY): Payer: Medicaid Other | Admitting: Psychiatry

## 2011-09-18 MED ORDER — MIRTAZAPINE 15 MG PO TABS: 15.0000 mg | ORAL_TABLET | Freq: Every day | ORAL | Status: DC

## 2011-09-18 NOTE — Telephone Encounter (Signed)
Try a full Remeron. Keep appointment on Monday.

## 2011-09-22 ENCOUNTER — Ambulatory Visit (HOSPITAL_COMMUNITY): Payer: Self-pay | Admitting: Psychiatry

## 2011-09-22 ENCOUNTER — Encounter (HOSPITAL_COMMUNITY): Payer: Self-pay | Admitting: Psychiatry

## 2011-09-22 ENCOUNTER — Ambulatory Visit (INDEPENDENT_AMBULATORY_CARE_PROVIDER_SITE_OTHER): Payer: Medicaid Other | Admitting: Psychiatry

## 2011-09-22 VITALS — BP 120/78 | Ht 67.0 in | Wt 152.0 lb

## 2011-09-22 DIAGNOSIS — F411 Generalized anxiety disorder: Secondary | ICD-10-CM

## 2011-09-22 DIAGNOSIS — F3181 Bipolar II disorder: Secondary | ICD-10-CM

## 2011-09-22 DIAGNOSIS — F3189 Other bipolar disorder: Secondary | ICD-10-CM

## 2011-09-22 MED ORDER — ARIPIPRAZOLE 5 MG PO TABS: 5.0000 mg | ORAL_TABLET | Freq: Every day | ORAL | Status: DC
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
## 2011-10-22 ENCOUNTER — Ambulatory Visit (INDEPENDENT_AMBULATORY_CARE_PROVIDER_SITE_OTHER): Payer: Medicaid Other | Admitting: Psychiatry

## 2011-10-22 VITALS — BP 108/70 | Ht 67.0 in | Wt 149.0 lb

## 2011-10-22 DIAGNOSIS — F319 Bipolar disorder, unspecified: Secondary | ICD-10-CM

## 2011-10-22 DIAGNOSIS — F3181 Bipolar II disorder: Secondary | ICD-10-CM

## 2011-10-22 DIAGNOSIS — F411 Generalized anxiety disorder: Secondary | ICD-10-CM

## 2011-10-24 ENCOUNTER — Other Ambulatory Visit (HOSPITAL_COMMUNITY): Payer: Self-pay | Admitting: Psychiatry

## 2011-10-24 ENCOUNTER — Encounter (HOSPITAL_COMMUNITY): Payer: Self-pay | Admitting: Psychiatry

## 2011-10-24 MED ORDER — SERTRALINE HCL 100 MG PO TABS: 100.0000 mg | ORAL_TABLET | Freq: Every day | ORAL | Status: DC

## 2011-10-24 NOTE — Progress Notes (Signed)
   [facility] Behavioral Health Follow-up Outpatient Visit  AVYANA PUFFENBARGER 1976/03/28   Subjective: The patient is a 36 year old female who has been followed by [facility] since April 2012. She is currently diagnosed with bipolar disorder and generalized anxiety disorder. Current medications include Lamictal, Zoloft, Wellbutrin XL, and Xanax as needed. At her last appointment, she continued with low mood and energy. She had been more irritable. Remeron wasn't helping so he discontinued it. I started her on Abilify 5 mg daily. She presents today with her middle son. She is doing much better. She is diagnosed with severe asthma, and has been on Advair twice a day. She was advised that her fatigue could have been stemming from her breathing issues. She also has been started on thyroid medication. She is much brighter. She feels much more like herself. Sleep is much improved. She is down 3 pounds today, which she is happy about. She is not sure if they increased energy is due to the Abilify, the Advair, or the Synthroid. Her son will be coming home today for a few days. He has decided not to live with his father. She is relieved by this. He denies any mood swings or panic attacks. Overall she's doing well.  Filed Vitals:   10/24/11 0939  BP: 108/70    Mental Status Examination  Appearance: [name] dressed Alert: Yes Attention: good  Cooperative: Yes Eye Contact: Good Speech: Regular rate rhythm and volume Psychomotor Activity: Normal Memory/Concentration: Intact Oriented: person, place, time/date and situation Mood: Euthymic Affect: Restricted Thought Processes and Associations: Logical Fund of Knowledge: Fair Thought Content: No suicidal or homicidal thoughts Insight: Fair Judgement: Fair  Diagnosis: Bipolar disorder  Treatment Plan: I will not make any changes today. I will see the patient back in 2 months.   Jamse Mead, MD

## 2011-12-02 ENCOUNTER — Other Ambulatory Visit (HOSPITAL_COMMUNITY): Payer: Self-pay | Admitting: Psychiatry

## 2011-12-02 MED ORDER — ALPRAZOLAM 1 MG PO TABS: 1.0000 mg | ORAL_TABLET | Freq: Every evening | ORAL | Status: DC | PRN

## 2011-12-10 ENCOUNTER — Ambulatory Visit (INDEPENDENT_AMBULATORY_CARE_PROVIDER_SITE_OTHER): Payer: Medicaid Other | Admitting: Psychiatry

## 2011-12-10 ENCOUNTER — Encounter (HOSPITAL_COMMUNITY): Payer: Self-pay | Admitting: Psychiatry

## 2011-12-10 VITALS — BP 112/70 | Ht 67.0 in | Wt 147.0 lb

## 2011-12-10 DIAGNOSIS — F319 Bipolar disorder, unspecified: Secondary | ICD-10-CM

## 2011-12-10 DIAGNOSIS — F3181 Bipolar II disorder: Secondary | ICD-10-CM

## 2011-12-10 DIAGNOSIS — F411 Generalized anxiety disorder: Secondary | ICD-10-CM

## 2011-12-10 MED ORDER — ARIPIPRAZOLE 10 MG PO TABS: 10.0000 mg | ORAL_TABLET | Freq: Every day | ORAL | Status: DC
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
## 2011-12-23 ENCOUNTER — Ambulatory Visit (INDEPENDENT_AMBULATORY_CARE_PROVIDER_SITE_OTHER): Payer: Medicaid Other | Admitting: Psychiatry

## 2011-12-23 ENCOUNTER — Encounter (HOSPITAL_COMMUNITY): Payer: Self-pay | Admitting: Psychiatry

## 2011-12-23 VITALS — BP 115/72 | Ht 67.0 in | Wt 147.0 lb

## 2011-12-23 DIAGNOSIS — F3181 Bipolar II disorder: Secondary | ICD-10-CM

## 2011-12-23 DIAGNOSIS — F411 Generalized anxiety disorder: Secondary | ICD-10-CM

## 2011-12-23 DIAGNOSIS — F319 Bipolar disorder, unspecified: Secondary | ICD-10-CM

## 2011-12-23 DIAGNOSIS — F329 Major depressive disorder, single episode, unspecified: Secondary | ICD-10-CM

## 2011-12-23 MED ORDER — LAMOTRIGINE 200 MG PO TABS: 200.0000 mg | ORAL_TABLET | Freq: Every day | ORAL | Status: DC

## 2011-12-23 MED ORDER — BUPROPION HCL ER (XL) 300 MG PO TB24: 300.0000 mg | ORAL_TABLET | ORAL | Status: DC

## 2011-12-23 NOTE — Progress Notes (Signed)
   [facility] Behavioral Health Follow-up Outpatient Visit  Tonya Montgomery November 16, 1975   Subjective: The patient is a 36 year old female who has been followed by [facility] since April 2012. She is currently diagnosed with bipolar disorder and generalized anxiety disorder. Current medications include Abilify, Lamictal, Zoloft, Wellbutrin XL, and Xanax as needed. At her last appointment, she presented early. She had been feeling "blah". She was not sleeping during the day. She was sleeping a little bit better at night. Her son has been staying with her for a short time. She had no motivation. At time I increased her Abilify to 10 mg daily. She presents today. She is feeling a little bit better. She has about 3 good days out of 7. During those 4 bad days, she does have some bouts where she feels better. She uses occasionally over the counter diet pills. She has been concerned about using them, because of the reported addictive potential. They mainly contain caffeine. She doesn't use any other forms of caffeine. I have told her am okay if she uses them in moderation. She states that they do give her energy.  Filed Vitals:   12/23/11 1224  BP: 115/72    Mental Status Examination  Appearance: Casually dressed Alert: Yes Attention: good  Cooperative: Yes Eye Contact: Good Speech: Regular rate rhythm and volume Psychomotor Activity: Normal Memory/Concentration: Intact Oriented: person, place, time/date and situation Mood: Euthymic Affect: Restricted Thought Processes and Associations: Logical Fund of Knowledge: Fair Thought Content: No suicidal or homicidal thoughts Insight: Fair Judgement: Fair  Diagnosis: Bipolar disorder  Treatment Plan: I will not make any changes today. I will see the patient back in one month.   Jamse Mead, MD

## 2012-01-08 ENCOUNTER — Telehealth (HOSPITAL_COMMUNITY): Payer: Self-pay

## 2012-01-08 MED ORDER — ARIPIPRAZOLE 15 MG PO TABS: 15.0000 mg | ORAL_TABLET | Freq: Every day | ORAL | Status: DC

## 2012-01-08 NOTE — Telephone Encounter (Signed)
Very down and depressed not doing well. NO SI or HI. Would like to talk to you

## 2012-01-08 NOTE — Telephone Encounter (Signed)
Patient continues to feel low. We'll increase Abilify to 15 mg daily. This is what pulled her out of her last slump. Patient update in 2 weeks.

## 2012-01-12 ENCOUNTER — Telehealth (HOSPITAL_COMMUNITY): Payer: Self-pay | Admitting: Psychiatry

## 2012-01-12 ENCOUNTER — Telehealth (HOSPITAL_COMMUNITY): Payer: Self-pay

## 2012-01-12 NOTE — Telephone Encounter (Signed)
Patient was called. She denied SI/HI/AVH or medication side effects. She was informed about her appointment with Dr. Christell Constant on 01/22/2012.   PLAN: The patient has refills of alprazolam and was told she could pick this up at the pharmacy.

## 2012-01-12 NOTE — Telephone Encounter (Signed)
Patient called. Have sent in medications.

## 2012-01-12 NOTE — Telephone Encounter (Signed)
Called patient informed her she could take Alprazolam 1 mg  TID PRN anxiety.

## 2012-01-12 NOTE — Telephone Encounter (Addendum)
The patient report that she has been using upto Xanax 1-3 mg daily, and on occasion has used Xanax ODT if she felt she needed a faster action. No reported SI/HI/AVH. Pharmacy reports patient "did not want ODT." Patient has not fill ODT since 11/19/2011.  PLAN:  Talked with patient's provider. Will call in prescription for Xanax 1 mg TID PRN anxiety. Will provide #30 with no refills as patient has an appointment on 01/22/2012 at 11:00 AM. Will discontinue ODT formulation as patient has not filled this medication.

## 2012-01-13 ENCOUNTER — Other Ambulatory Visit (HOSPITAL_COMMUNITY): Payer: Self-pay | Admitting: Psychiatry

## 2012-01-22 ENCOUNTER — Encounter (HOSPITAL_COMMUNITY): Payer: Self-pay | Admitting: Psychiatry

## 2012-01-22 ENCOUNTER — Ambulatory Visit (INDEPENDENT_AMBULATORY_CARE_PROVIDER_SITE_OTHER): Payer: Medicaid Other | Admitting: Psychiatry

## 2012-01-22 VITALS — BP 112/70 | Ht 67.0 in | Wt 150.0 lb

## 2012-01-22 DIAGNOSIS — F319 Bipolar disorder, unspecified: Secondary | ICD-10-CM

## 2012-01-22 DIAGNOSIS — F3181 Bipolar II disorder: Secondary | ICD-10-CM

## 2012-01-22 DIAGNOSIS — F411 Generalized anxiety disorder: Secondary | ICD-10-CM

## 2012-01-22 MED ORDER — ALPRAZOLAM 1 MG PO TABS: 1.0000 mg | ORAL_TABLET | Freq: Three times a day (TID) | ORAL | Status: DC

## 2012-01-22 MED ORDER — SERTRALINE HCL 100 MG PO TABS: 100.0000 mg | ORAL_TABLET | Freq: Every day | ORAL | Status: DC

## 2012-01-22 NOTE — Progress Notes (Addendum)
   [facility] Behavioral Health Follow-up Outpatient Visit  Tonya Montgomery Aug 23, 1975   Subjective: The patient is a 36 year old female who has been followed by [facility] since April 2012. She is currently diagnosed with bipolar disorder and generalized anxiety disorder. Current medications include Abilify, Lamictal, Zoloft, Wellbutrin XL, and Xanax as needed. At her last appointment, I did not change any medications. The patient called on 12/29/2011 and stated she was still feeling very low. At that time I increased her Abilify to 15 mg daily. The patient called again on 01/12/2012 looking for refill of her Xanax. She takes 2 a day as needed, medication he takes 30. The patient presents today. She is in a much better mood. Her son has been back home for 4 days. This appears to be going well. She is enrolled him in school. She still waiting for her new employment to start. The kids are behaving. She only had one panic attack in the last week. When she has a panic attack, she takes the quick-acting Xanax. I advised her that I cannot prescribe both that type and regular tablets. The patient endorses good sleep and appetite. She does continue to take her caffeine-based diet pills. She feels that make her feel better.  Filed Vitals:   01/22/12 1106  BP: 112/70    Mental Status Examination  Appearance: Casually dressed Alert: Yes Attention: good  Cooperative: Yes Eye Contact: Good Speech: Regular rate rhythm and volume Psychomotor Activity: Normal Memory/Concentration: Intact Oriented: person, place, time/date and situation Mood: Euthymic Affect: Restricted Thought Processes and Associations: Logical Fund of Knowledge: Fair Thought Content: No suicidal or homicidal thoughts Insight: Fair Judgement: Fair  Diagnosis: Bipolar disorder  Treatment Plan: I will not make any changes today. I will see the patient back in two months.   Jamse Mead, MD

## 2012-01-28 ENCOUNTER — Telehealth (HOSPITAL_COMMUNITY): Payer: Self-pay

## 2012-01-28 NOTE — Telephone Encounter (Signed)
Still not doing well. Would like to speak to you. When asked no SI/HI

## 2012-01-28 NOTE — Telephone Encounter (Signed)
Recommend therapy.  Patient not interested.  Patient still very sedated. Will decrease Zoloft to 50 mg daily to see if this is more of a serotonin-type reaction. Patient update next week.

## 2012-02-04 ENCOUNTER — Other Ambulatory Visit (HOSPITAL_COMMUNITY): Payer: Self-pay | Admitting: Psychiatry

## 2012-04-25 ENCOUNTER — Other Ambulatory Visit (HOSPITAL_COMMUNITY): Payer: Self-pay | Admitting: Psychiatry

## 2012-05-03 ENCOUNTER — Encounter (HOSPITAL_COMMUNITY): Payer: Self-pay | Admitting: Psychiatry

## 2012-05-03 ENCOUNTER — Ambulatory Visit (INDEPENDENT_AMBULATORY_CARE_PROVIDER_SITE_OTHER): Payer: Medicaid Other | Admitting: Psychiatry

## 2012-05-03 VITALS — BP 118/68 | Ht 67.0 in | Wt 162.0 lb

## 2012-05-03 DIAGNOSIS — F411 Generalized anxiety disorder: Secondary | ICD-10-CM

## 2012-05-03 DIAGNOSIS — F319 Bipolar disorder, unspecified: Secondary | ICD-10-CM

## 2012-05-03 DIAGNOSIS — F3181 Bipolar II disorder: Secondary | ICD-10-CM

## 2012-05-03 MED ORDER — TOPIRAMATE 50 MG PO TABS: ORAL_TABLET | ORAL | Status: DC
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
## 2012-05-04 ENCOUNTER — Telehealth (HOSPITAL_COMMUNITY): Payer: Self-pay

## 2012-05-04 NOTE — Telephone Encounter (Signed)
Administrative assistant informed patient would take 1-2 weeks to start feeling a difference with the Topamax.

## 2012-05-11 ENCOUNTER — Other Ambulatory Visit (HOSPITAL_COMMUNITY): Payer: Self-pay | Admitting: Psychiatry

## 2012-05-12 ENCOUNTER — Telehealth (HOSPITAL_COMMUNITY): Payer: Self-pay

## 2012-05-12 MED ORDER — TOPIRAMATE 100 MG PO TABS: ORAL_TABLET | ORAL | Status: DC

## 2012-05-12 NOTE — Telephone Encounter (Signed)
Go ahead and increase.  Will send refill with higher dosage.

## 2012-05-12 NOTE — Telephone Encounter (Signed)
NO CHANGE IN APPETITE WITH TOPAMAX CAN SHE INCREASE THIS WEEK. ALSO FEELING A LITTLE BETTER

## 2012-05-24 ENCOUNTER — Other Ambulatory Visit (HOSPITAL_COMMUNITY): Payer: Self-pay | Admitting: Psychiatry

## 2012-06-03 ENCOUNTER — Ambulatory Visit (HOSPITAL_COMMUNITY): Payer: Self-pay | Admitting: Psychiatry

## 2012-06-07 ENCOUNTER — Encounter (HOSPITAL_COMMUNITY): Payer: Self-pay | Admitting: Psychiatry

## 2012-06-07 ENCOUNTER — Ambulatory Visit (INDEPENDENT_AMBULATORY_CARE_PROVIDER_SITE_OTHER): Payer: Medicaid Other | Admitting: Psychiatry

## 2012-06-07 VITALS — BP 112/65 | Ht 67.0 in | Wt 157.0 lb

## 2012-06-07 DIAGNOSIS — F3162 Bipolar disorder, current episode mixed, moderate: Secondary | ICD-10-CM

## 2012-06-07 DIAGNOSIS — F319 Bipolar disorder, unspecified: Secondary | ICD-10-CM

## 2012-06-07 DIAGNOSIS — F411 Generalized anxiety disorder: Secondary | ICD-10-CM

## 2012-06-07 MED ORDER — TOPIRAMATE 100 MG PO TABS: 100.0000 mg | ORAL_TABLET | Freq: Two times a day (BID) | ORAL | Status: DC

## 2012-06-07 NOTE — Progress Notes (Signed)
   [facility] Behavioral Health Follow-up Outpatient Visit  Tonya Montgomery [birth year]   Subjective: The patient is a 38 year old female who has been followed by [facility] since April 2012. She is currently diagnosed with bipolar disorder and generalized anxiety disorder. Current medications include Abilify, Lamictal, Zoloft, Wellbutrin XL, and Xanax as needed. At her last appointment, I started her on Topamax. The patient called on 05/12/2012 stating she was feeling a little effect from it, and would like to go up. I increased at that point. She presents today. She is only working 2 jobs currently. Her son will be going to a new group home on Wednesday. He will not have to change schools, and will be closed to the family. Her 63-year-old is in therapy with [employer]. He appears to like his therapist. The patient is down 5 pounds today. She is encouraged by this. She feels her mood has been stable. She does feel like the Topamax is helping. Her kids are now in daycare. This is a huge relief. She is sleeping well, but not too much sleep. Patient feels overall she is doing well. She does admit that she consumes a red bull 3-4 times a week.  Filed Vitals:   06/07/12 1114  BP: 112/65    Mental Status Examination  Appearance: Casually dressed Alert: Yes Attention: good  Cooperative: Yes Eye Contact: Good Speech: Regular rate rhythm and volume Psychomotor Activity: Normal Memory/Concentration: Intact Oriented: person, place, time/date and situation Mood: Euthymic Affect: Restricted Thought Processes and Associations: Logical Fund of Knowledge: Fair Thought Content: No suicidal or homicidal thoughts Insight: Fair Judgement: Fair  Diagnosis: Bipolar disorder  Treatment Plan: I will continue Topamax 100 mg twice a day. I will continue Abilify, Lamictal, Zoloft, Wellbutrin XL, and Zoloft. I will see the patient back in 2 months. Patient may call with concerns.  Jamse Mead, MD

## 2012-07-24 ENCOUNTER — Other Ambulatory Visit (HOSPITAL_COMMUNITY): Payer: Self-pay | Admitting: Psychiatry

## 2012-08-05 ENCOUNTER — Encounter (HOSPITAL_COMMUNITY): Payer: Self-pay | Admitting: Psychiatry

## 2012-08-05 ENCOUNTER — Ambulatory Visit (INDEPENDENT_AMBULATORY_CARE_PROVIDER_SITE_OTHER): Payer: 59 | Admitting: Psychiatry

## 2012-08-05 VITALS — BP 96/62 | Ht 67.0 in | Wt 150.0 lb

## 2012-08-05 DIAGNOSIS — F319 Bipolar disorder, unspecified: Secondary | ICD-10-CM

## 2012-08-05 DIAGNOSIS — F3181 Bipolar II disorder: Secondary | ICD-10-CM

## 2012-08-05 DIAGNOSIS — F411 Generalized anxiety disorder: Secondary | ICD-10-CM

## 2012-08-05 MED ORDER — ARIPIPRAZOLE 20 MG PO TABS: ORAL_TABLET | ORAL | Status: DC

## 2012-08-05 NOTE — Progress Notes (Signed)
[facility] Behavioral Health Follow-up Outpatient Visit  Tonya Montgomery 1975-10-07   Subjective: The patient is a 37 year old female who has been followed by [facility] since April 2012. She is currently diagnosed with bipolar disorder and generalized anxiety disorder. Current medications include Abilify, Lamictal, Zoloft, Wellbutrin XL, and Xanax as needed. At her last appointment, I did not make any changes. The patient was doing well. She is down 5 pounds. Her son was starting a new group home. The patient presents today. She is down another 7 pounds. She is very happy about this. Both her jobs are going well. Her son is on spring break. He will be home with her from yesterday through Sunday. Patient is concerned about behavior. She states that she is "just okay". She is sleeping well. She is now getting assistance with daycare which is helping with the kids. They stay overnight. Patient is just has a low mood. She is asking if we can go up on Abilify. She reports this helped more than anything else.  Filed Vitals:   08/05/12 1108  BP: 96/62   Active Ambulatory Problems    Diagnosis Date Noted  . Bipolar 2 disorder 03/18/2010  . ABSCESS, AXILLA, RIGHT 03/18/2010  . GAD (generalized anxiety disorder) 06/18/2011   Resolved Ambulatory Problems    Diagnosis Date Noted  . No Resolved Ambulatory Problems   Past Medical History  Diagnosis Date  . Anxiety   . Bipolar disorder   . Carpal tunnel syndrome   . Fibromyalgia   . Asthma    Current Outpatient Prescriptions on File Prior to Visit  Medication Sig Dispense Refill  . ADVAIR DISKUS 250-50 MCG/DOSE AEPB       . albuterol (PROVENTIL HFA;VENTOLIN HFA) 108 (90 BASE) MCG/ACT inhaler Inhale 1-2 puffs into the lungs every 4 (four) hours as needed for wheezing or shortness of breath.  1 Inhaler  0  . ALPRAZolam (XANAX) 1 MG tablet TAKE 1 TABLET 3 TIMES A DAY AS NEEDED  90 tablet  2  . Biotin 1000 MCG tablet Take 1,000  mcg by mouth daily.      Marland Kitchen buPROPion (WELLBUTRIN XL) 300 MG 24 hr tablet Take 1 tablet (300 mg total) by mouth every morning.  30 tablet  5  . CELEBREX 200 MG capsule       . cyclobenzaprine (FLEXERIL) 10 MG tablet Take 10 mg by mouth 3 (three) times daily as needed. Per Irene Limbo. Reports tries to only take one time per day.      . lamoTRIgine (LAMICTAL) 200 MG tablet Take 1 tablet (200 mg total) by mouth daily.  30 tablet  5  . levothyroxine (SYNTHROID, LEVOTHROID) 50 MCG tablet       . Milnacipran HCl (SAVELLA) 100 MG TABS Take 100 mg by mouth 2 (two) times daily. Per Irene Limbo       . sertraline (ZOLOFT) 100 MG tablet TAKE 1 TABLET (100 MG TOTAL) BY MOUTH DAILY.  30 tablet  2  . topiramate (TOPAMAX) 100 MG tablet Take 1 tablet (100 mg total) by mouth 2 (two) times daily.  60 tablet  2   No current facility-administered medications on file prior to visit.   Review of Systems - General ROS: negative for - sleep disturbance or weight gain Psychological ROS: positive for - depression Cardiovascular ROS: no chest pain or dyspnea on exertion Musculoskeletal ROS: negative for - gait disturbance or muscular weakness Neurological ROS: negative for - headaches or seizures  Mental Status Examination  Appearance: Casually dressed Alert: Yes Attention: good  Cooperative: Yes Eye Contact: Good Speech: Regular rate rhythm and volume Psychomotor Activity: Normal Memory/Concentration: Intact Oriented: person, place, time/date and situation Mood: Euthymic Affect: Restricted Thought Processes and Associations: Logical Fund of Knowledge: Fair Thought Content: No suicidal or homicidal thoughts Insight: Fair Judgement: Fair  Diagnosis: Bipolar disorder  Treatment Plan: I will increase the Abilify to 20 mg daily. I will continue the Lamictal, Zoloft, Wellbutrin, Xanax, and Topamax. I will see the patient back in 6 weeks. Patient may call with concerns.  Jamse Mead,  MD

## 2012-08-09 ENCOUNTER — Other Ambulatory Visit (HOSPITAL_COMMUNITY): Payer: Self-pay | Admitting: Psychiatry

## 2012-09-16 ENCOUNTER — Ambulatory Visit (HOSPITAL_COMMUNITY): Payer: Self-pay | Admitting: Psychiatry

## 2012-09-17 ENCOUNTER — Encounter (HOSPITAL_COMMUNITY): Payer: Self-pay | Admitting: Psychiatry

## 2012-09-17 ENCOUNTER — Ambulatory Visit (INDEPENDENT_AMBULATORY_CARE_PROVIDER_SITE_OTHER): Payer: 59 | Admitting: Psychiatry

## 2012-09-17 VITALS — BP 100/62 | Ht 67.0 in | Wt 152.0 lb

## 2012-09-17 DIAGNOSIS — F411 Generalized anxiety disorder: Secondary | ICD-10-CM

## 2012-09-17 DIAGNOSIS — F3181 Bipolar II disorder: Secondary | ICD-10-CM

## 2012-09-17 DIAGNOSIS — F3189 Other bipolar disorder: Secondary | ICD-10-CM

## 2012-09-17 MED ORDER — ALPRAZOLAM 1 MG PO TABS: ORAL_TABLET | ORAL | Status: DC

## 2012-09-17 NOTE — Progress Notes (Signed)
[facility] Behavioral Health Follow-up Outpatient Visit  TYSHA GRISMORE 1976/04/02   Subjective: The patient is a 37 year old female who has been followed by [facility] since April 2012. She is currently diagnosed with bipolar disorder and generalized anxiety disorder. Current medications include Abilify, Lamictal, Zoloft, Wellbutrin XL, and Xanax as needed. At her last appointment, I increased her Abilify at 20 mg daily. She presents today alone. Her son is still in a group home. They have not spoken since Easter. He is in a level III, but because behavior he may be moving to a level for her. The patient's ex-husband is now off any illegal substances. He is attending church. They have been working on reconciliation for the past 3 weeks. The patient continues to work 2 jobs. She has been off her red bull for 3 days. She's exhausted. She is asking for a caffeine substitute. She thinks things are going okay. She is not feeling as depressed. She is using 2 Xanax daily.  Filed Vitals:   09/17/12 1322  BP: 100/62   Active Ambulatory Problems    Diagnosis Date Noted  . Bipolar 2 disorder 03/18/2010  . ABSCESS, AXILLA, RIGHT 03/18/2010  . GAD (generalized anxiety disorder) 06/18/2011   Resolved Ambulatory Problems    Diagnosis Date Noted  . No Resolved Ambulatory Problems   Past Medical History  Diagnosis Date  . Anxiety   . Bipolar disorder   . Carpal tunnel syndrome   . Fibromyalgia   . Asthma    Current Outpatient Prescriptions on File Prior to Visit  Medication Sig Dispense Refill  . ADVAIR DISKUS 250-50 MCG/DOSE AEPB       . albuterol (PROVENTIL HFA;VENTOLIN HFA) 108 (90 BASE) MCG/ACT inhaler Inhale 1-2 puffs into the lungs every 4 (four) hours as needed for wheezing or shortness of breath.  1 Inhaler  0  . ARIPiprazole (ABILIFY) 20 MG tablet TAKE 1 TABLET (15 MG TOTAL) BY MOUTH DAILY.  30 tablet  2  . Biotin 1000 MCG tablet Take 1,000 mcg by mouth daily.      Marland Kitchen  buPROPion (WELLBUTRIN XL) 300 MG 24 hr tablet TAKE 1 TABLET (300 MG TOTAL) BY MOUTH EVERY MORNING.  30 tablet  5  . CELEBREX 200 MG capsule       . cyclobenzaprine (FLEXERIL) 10 MG tablet Take 10 mg by mouth 3 (three) times daily as needed. Per Irene Limbo. Reports tries to only take one time per day.      . lamoTRIgine (LAMICTAL) 200 MG tablet TAKE 1 TABLET (200 MG TOTAL) BY MOUTH DAILY.  30 tablet  5  . levothyroxine (SYNTHROID, LEVOTHROID) 50 MCG tablet       . Milnacipran HCl (SAVELLA) 100 MG TABS Take 100 mg by mouth 2 (two) times daily. Per Irene Limbo       . sertraline (ZOLOFT) 100 MG tablet TAKE 1 TABLET (100 MG TOTAL) BY MOUTH DAILY.  30 tablet  2  . topiramate (TOPAMAX) 100 MG tablet Take 1 tablet (100 mg total) by mouth 2 (two) times daily.  60 tablet  2   No current facility-administered medications on file prior to visit.   Review of Systems - General ROS: negative for - sleep disturbance or weight gain Psychological ROS: positive for - depression Cardiovascular ROS: no chest pain or dyspnea on exertion Musculoskeletal ROS: negative for - gait disturbance or muscular weakness Neurological ROS: negative for - headaches or seizures  Mental Status Examination  Appearance: Casually  dressed Alert: Yes Attention: good  Cooperative: Yes Eye Contact: Good Speech: Regular rate rhythm and volume Psychomotor Activity: Normal Memory/Concentration: Intact Oriented: person, place, time/date and situation Mood: Euthymic Affect: Restricted Thought Processes and Associations: Logical Fund of Knowledge: Fair Thought Content: No suicidal or homicidal thoughts Insight: Fair Judgement: Fair  Diagnosis: Bipolar disorder 2, most recent episode depressed; generalized anxiety disorder  Treatment Plan:  I will continue the Abilify, Lamictal, Zoloft, Wellbutrin, Xanax, and Topamax. I will see the patient back in 3 months. Patient may call with concerns.  Jamse Mead, MD

## 2012-09-22 ENCOUNTER — Other Ambulatory Visit (HOSPITAL_COMMUNITY): Payer: Self-pay | Admitting: Psychiatry

## 2012-11-05 ENCOUNTER — Other Ambulatory Visit (HOSPITAL_COMMUNITY): Payer: Self-pay | Admitting: Psychiatry

## 2012-11-05 DIAGNOSIS — F3189 Other bipolar disorder: Secondary | ICD-10-CM

## 2012-11-30 ENCOUNTER — Telehealth (HOSPITAL_COMMUNITY): Payer: Self-pay

## 2012-11-30 NOTE — Telephone Encounter (Signed)
Returned call. LM

## 2012-12-01 ENCOUNTER — Ambulatory Visit (INDEPENDENT_AMBULATORY_CARE_PROVIDER_SITE_OTHER): Payer: 59 | Admitting: Psychiatry

## 2012-12-01 ENCOUNTER — Encounter (HOSPITAL_COMMUNITY): Payer: Self-pay | Admitting: Psychiatry

## 2012-12-01 VITALS — BP 102/64 | Ht 67.0 in | Wt 152.0 lb

## 2012-12-01 DIAGNOSIS — F3181 Bipolar II disorder: Secondary | ICD-10-CM

## 2012-12-01 DIAGNOSIS — F411 Generalized anxiety disorder: Secondary | ICD-10-CM

## 2012-12-01 DIAGNOSIS — F3189 Other bipolar disorder: Secondary | ICD-10-CM

## 2012-12-01 NOTE — Progress Notes (Signed)
[facility] Behavioral Health Follow-up Outpatient Visit  Tonya Montgomery 12-May-1975   Subjective: The patient is a 37 year old female who has been followed by [facility] since April 2012. She is currently diagnosed with bipolar disorder and generalized anxiety disorder. Current medications include Abilify, Lamictal, Zoloft, Wellbutrin XL, and Xanax as needed. At her last appointment, I did not make any changes. The patient called yesterday in crisis. She was seen today for an earlier appointment. The patient endorses severe fatigue for approximately 3 weeks. She has seen her primary care physician, and blood work is normal. She was also seen in the emergency department last evening after having an acute asthma exacerbation. Once again blood work was normal. The patient reports she has absolutely no energy. She has no motivation. She does not feel like doing anything. She just wants to sleep. Her life is going well right now. She became engaged to her ex-husband 2 weeks ago at the beach. She's excited. Both her boys were there. The patient has not been using any red goal. She will occasionally use green tea. She has been avoiding Xanax secondary to its sedative properties. She is not working anymore. She continues to limit contact with her son in a group home. The patient denies any suicidal thoughts. She does not currently feel depressed. She is simply tired.  Filed Vitals:   12/01/12 1043  BP: 102/64   Active Ambulatory Problems    Diagnosis Date Noted  . Bipolar 2 disorder 03/18/2010  . ABSCESS, AXILLA, RIGHT 03/18/2010  . GAD (generalized anxiety disorder) 06/18/2011   Resolved Ambulatory Problems    Diagnosis Date Noted  . No Resolved Ambulatory Problems   Past Medical History  Diagnosis Date  . Anxiety   . Bipolar disorder   . Carpal tunnel syndrome   . Fibromyalgia   . Asthma    Current Outpatient Prescriptions on File Prior to Visit  Medication Sig Dispense  Refill  . ABILIFY 20 MG tablet TAKE 1 TABLET (15 MG TOTAL) BY MOUTH DAILY.  30 tablet  1  . ADVAIR DISKUS 250-50 MCG/DOSE AEPB       . albuterol (PROVENTIL HFA;VENTOLIN HFA) 108 (90 BASE) MCG/ACT inhaler Inhale 1-2 puffs into the lungs every 4 (four) hours as needed for wheezing or shortness of breath.  1 Inhaler  0  . ALPRAZolam (XANAX) 1 MG tablet TAKE 1 TABLET 3 TIMES A DAY AS NEEDED  90 tablet  2  . Biotin 1000 MCG tablet Take 1,000 mcg by mouth daily.      Marland Kitchen buPROPion (WELLBUTRIN XL) 300 MG 24 hr tablet TAKE 1 TABLET (300 MG TOTAL) BY MOUTH EVERY MORNING.  30 tablet  5  . CELEBREX 200 MG capsule       . cyclobenzaprine (FLEXERIL) 10 MG tablet Take 10 mg by mouth 3 (three) times daily as needed. Per Irene Limbo. Reports tries to only take one time per day.      . lamoTRIgine (LAMICTAL) 200 MG tablet TAKE 1 TABLET (200 MG TOTAL) BY MOUTH DAILY.  30 tablet  5  . levothyroxine (SYNTHROID, LEVOTHROID) 50 MCG tablet       . Milnacipran HCl (SAVELLA) 100 MG TABS Take 100 mg by mouth 2 (two) times daily. Per Irene Limbo       . topiramate (TOPAMAX) 100 MG tablet TAKE 1 TABLET (100 MG TOTAL) BY MOUTH 2 (TWO) TIMES DAILY.  60 tablet  2   No current facility-administered medications on file prior  to visit.   Review of Systems - General ROS: negative for - sleep disturbance or weight gain Psychological ROS: positive for - depression Cardiovascular ROS: no chest pain or dyspnea on exertion Musculoskeletal ROS: negative for - gait disturbance or muscular weakness Neurological ROS: negative for - headaches or seizures  Mental Status Examination  Appearance: Casually dressed Alert: Yes Attention: good  Cooperative: Yes Eye Contact: Good Speech: Regular rate rhythm and volume Psychomotor Activity: Normal Memory/Concentration: Intact Oriented: person, place, time/date and situation Mood: Euthymic Affect: Restricted Thought Processes and Associations: Logical Fund of Knowledge:  Fair Thought Content: No suicidal or homicidal thoughts Insight: Fair Judgement: Fair  Diagnosis: Bipolar disorder 2, most recent episode depressed; generalized anxiety disorder  Treatment Plan:  I will discontinue the patient's Zoloft to insure is not interacting with patients Savella. I will continue the Abilify, Lamictal, Wellbutrin, Xanax, and Topamax. I will see the patient back in one month. Patient may call with concerns.  Jamse Mead, MD

## 2012-12-09 ENCOUNTER — Emergency Department
Admission: EM | Admit: 2012-12-09 | Discharge: 2012-12-09 | Disposition: A | Discharge status: Home / Self Care | Payer: Medicaid Other | Source: Home / Self Care

## 2012-12-09 ENCOUNTER — Encounter: Payer: Self-pay | Admitting: *Deleted

## 2012-12-09 DIAGNOSIS — J3489 Other specified disorders of nose and nasal sinuses: Secondary | ICD-10-CM

## 2012-12-09 MED ORDER — MUPIROCIN CALCIUM 2 % NA OINT: TOPICAL_OINTMENT | NASAL | Status: DC

## 2012-12-09 MED ORDER — TRAMADOL HCL 50 MG PO TABS: 50.0000 mg | ORAL_TABLET | Freq: Four times a day (QID) | ORAL | Status: DC | PRN

## 2012-12-09 MED ORDER — CEPHALEXIN 500 MG PO CAPS: 500.0000 mg | ORAL_CAPSULE | Freq: Two times a day (BID) | ORAL | Status: DC

## 2012-12-09 NOTE — ED Notes (Signed)
Tonya Montgomery c/o left sided facial & nasal pain x 2 days. Denies fever, congestion or drainage.

## 2012-12-09 NOTE — ED Provider Notes (Signed)
CSN: [id number]     Arrival date & time 12/09/12  1159 History     None    Chief Complaint  Patient presents with  . Facial Pain   (Consider location/radiation/quality/duration/timing/severity/associated sxs/prior Treatment) HPI Patient complains of a sore left nostril.  Has been going on for about 2 days.  Denies fever congestion and drainage.  Left nostril her to touch.  Denies nose picking or hair pulling or any other trauma.  Hurts to touch and has not been using any medicine.  No history of MRSA or other types of skin infections per patient.  Past Medical History  Diagnosis Date  . Anxiety   . Bipolar disorder   . Carpal tunnel syndrome   . Fibromyalgia   . Asthma    History reviewed. No pertinent past surgical history. Family History  Problem Relation Age of Onset  . Depression Mother   . Cancer Mother     lung  . Depression Maternal Aunt   . ADD / ADHD Son   . Anxiety disorder Son   . ADD / ADHD Son    History  Substance Use Topics  . Smoking status: Never Smoker   . Smokeless tobacco: Never Used  . Alcohol Use: 0.0 oz/week     Comment: Per patient report one  drink of wine every 2-3 months if that.   OB History   Grav Para Term Preterm Abortions TAB SAB Ect Mult Living                 Review of Systems  All other systems reviewed and are negative.    Allergies  Review of patient's allergies indicates no known allergies.  Home Medications   Current Outpatient Rx  Name  Route  Sig  Dispense  Refill  . ABILIFY 20 MG tablet      TAKE 1 TABLET (15 MG TOTAL) BY MOUTH DAILY.   30 tablet   1   . ADVAIR DISKUS 250-50 MCG/DOSE AEPB               . albuterol (PROVENTIL HFA;VENTOLIN HFA) 108 (90 BASE) MCG/ACT inhaler   Inhalation   Inhale 1-2 puffs into the lungs every 4 (four) hours as needed for wheezing or shortness of breath.   1 Inhaler   0   . Biotin 1000 MCG tablet   Oral   Take 1,000 mcg by mouth daily.         Marland Kitchen buPROPion  (WELLBUTRIN XL) 300 MG 24 hr tablet      TAKE 1 TABLET (300 MG TOTAL) BY MOUTH EVERY MORNING.   30 tablet   5   . cyclobenzaprine (FLEXERIL) 10 MG tablet   Oral   Take 10 mg by mouth 3 (three) times daily as needed. Per Irene Limbo. Reports tries to only take one time per day.         . lamoTRIgine (LAMICTAL) 200 MG tablet      TAKE 1 TABLET (200 MG TOTAL) BY MOUTH DAILY.   30 tablet   5   . Milnacipran HCl (SAVELLA) 100 MG TABS   Oral   Take 100 mg by mouth 2 (two) times daily. Per Irene Limbo          . ALPRAZolam (XANAX) 1 MG tablet      TAKE 1 TABLET 3 TIMES A DAY AS NEEDED   90 tablet   2   . cephALEXin (KEFLEX) 500 MG capsule   Oral  Take 1 capsule (500 mg total) by mouth 2 (two) times daily.   14 capsule   0   . levothyroxine (SYNTHROID, LEVOTHROID) 50 MCG tablet               . mupirocin nasal ointment (BACTROBAN) 2 %      Apply in L nostril BID   10 g   0   . topiramate (TOPAMAX) 100 MG tablet      TAKE 1 TABLET (100 MG TOTAL) BY MOUTH 2 (TWO) TIMES DAILY.   60 tablet   2   . traMADol (ULTRAM) 50 MG tablet   Oral   Take 1 tablet (50 mg total) by mouth every 6 (six) hours as needed for pain.   16 tablet   0    BP 108/75  Pulse 86  Temp(Src) 97.7 F (36.5 C) (Oral)  Resp 16  Ht 5\' 7"  (1.702 m)  Wt 152 lb (68.947 kg)  BMI 23.8 kg/m2  SpO2 100% Physical Exam  Nursing note and vitals reviewed. Constitutional: She is oriented to person, place, and time. She appears well-developed and well-nourished. She does not have a sickly appearance. No distress.  HENT:  Head: Normocephalic and atraumatic.  Right Ear: Tympanic membrane, external ear and ear canal normal.  Left Ear: Tympanic membrane, external ear and ear canal normal.  Mouth/Throat: Uvula is midline and oropharynx is clear and moist.  Right nostril is normal and left nostril does show some mucosal inflammation.  No septal hematoma.  Tenderness to palpation at the  left lateral nares.  No definite cellulitis however the patient does have some very minimal swelling and at the crease between the nares and the cheek.  Eyes: No scleral icterus.  Neck: Neck supple.  Cardiovascular: Regular rhythm and normal heart sounds.   Pulmonary/Chest: Effort normal and breath sounds normal. No respiratory distress.  Neurological: She is alert and oriented to person, place, and time.  Skin: Skin is warm and dry.  Psychiatric: She has a normal mood and affect. Her speech is normal.    ED Course   Procedures (including critical care time)  Labs Reviewed - No data to display No results found. 1. Nose pain     MDM   Patient likely with a small infection in nostril (pimple).  Will treat with Keflex and Bactroban nasal ointment to prevent worsening, encourage warm compresses.  Pt asked for pain medicine, so gave her Tramadol.  Follow up with PCP as needed.  If worsening, can follow up sooner.  Marlaine Hind, MD 12/09/12 1240

## 2012-12-21 ENCOUNTER — Ambulatory Visit (HOSPITAL_COMMUNITY): Payer: Self-pay | Admitting: Psychiatry

## 2012-12-21 ENCOUNTER — Telehealth (HOSPITAL_COMMUNITY): Payer: Self-pay

## 2012-12-21 NOTE — Telephone Encounter (Signed)
Still sleeping as much and still feeling way she was before

## 2012-12-21 NOTE — Telephone Encounter (Signed)
I will schedule the patient with Dr.Puthuvel for a second opinion. Patient will be running out of Medicaid. Will have to pay out of pocket.

## 2012-12-22 ENCOUNTER — Other Ambulatory Visit (HOSPITAL_COMMUNITY): Payer: Self-pay | Admitting: Psychiatry

## 2012-12-24 ENCOUNTER — Encounter (HOSPITAL_COMMUNITY): Payer: Self-pay | Admitting: Psychiatry

## 2012-12-24 ENCOUNTER — Ambulatory Visit (INDEPENDENT_AMBULATORY_CARE_PROVIDER_SITE_OTHER): Payer: 59 | Admitting: Psychiatry

## 2012-12-24 VITALS — BP 112/61 | HR 86 | Ht 67.0 in | Wt 151.5 lb

## 2012-12-24 DIAGNOSIS — F3130 Bipolar disorder, current episode depressed, mild or moderate severity, unspecified: Secondary | ICD-10-CM

## 2012-12-24 DIAGNOSIS — F411 Generalized anxiety disorder: Secondary | ICD-10-CM

## 2012-12-24 DIAGNOSIS — F314 Bipolar disorder, current episode depressed, severe, without psychotic features: Secondary | ICD-10-CM

## 2012-12-24 MED ORDER — BUPROPION HCL ER (XL) 150 MG PO TB24: 450.0000 mg | ORAL_TABLET | ORAL | Status: AC
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
## 2012-12-28 ENCOUNTER — Ambulatory Visit (HOSPITAL_COMMUNITY): Payer: Self-pay | Admitting: Psychiatry

## 2012-12-28 DIAGNOSIS — F314 Bipolar disorder, current episode depressed, severe, without psychotic features: Secondary | ICD-10-CM | POA: Insufficient documentation

## 2012-12-29 ENCOUNTER — Ambulatory Visit (HOSPITAL_COMMUNITY): Payer: Self-pay | Admitting: Psychiatry

## 2012-12-31 ENCOUNTER — Telehealth (HOSPITAL_COMMUNITY): Payer: Self-pay

## 2012-12-31 NOTE — Telephone Encounter (Signed)
Second attempt to call patient. Advised her to take all of the medication in the morning as she felt better doing this.

## 2012-12-31 NOTE — Telephone Encounter (Signed)
Called patient. Left message stating she should take wellbutrin xl 450 mg at 2 PM.

## 2013-01-03 ENCOUNTER — Ambulatory Visit (INDEPENDENT_AMBULATORY_CARE_PROVIDER_SITE_OTHER): Payer: 59 | Admitting: Psychiatry

## 2013-01-03 ENCOUNTER — Encounter (HOSPITAL_COMMUNITY): Payer: Self-pay | Admitting: Psychiatry

## 2013-01-03 VITALS — BP 115/64 | Ht 67.0 in | Wt 153.0 lb

## 2013-01-03 DIAGNOSIS — F3189 Other bipolar disorder: Secondary | ICD-10-CM

## 2013-01-03 DIAGNOSIS — F3181 Bipolar II disorder: Secondary | ICD-10-CM

## 2013-01-03 DIAGNOSIS — F411 Generalized anxiety disorder: Secondary | ICD-10-CM

## 2013-01-03 MED ORDER — ALPRAZOLAM 1 MG PO TABS: ORAL_TABLET | ORAL | Status: AC
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
## 2013-01-07 ENCOUNTER — Other Ambulatory Visit (HOSPITAL_COMMUNITY): Payer: Self-pay | Admitting: Psychiatry

## 2013-01-08 ENCOUNTER — Other Ambulatory Visit (HOSPITAL_COMMUNITY): Payer: Self-pay | Admitting: Psychiatry

## 2013-01-18 ENCOUNTER — Telehealth (HOSPITAL_COMMUNITY): Payer: Self-pay

## 2013-01-19 NOTE — Telephone Encounter (Signed)
Returned call- LM and suggested [facility] for treatment.

## 2013-02-09 ENCOUNTER — Ambulatory Visit (HOSPITAL_COMMUNITY): Payer: Self-pay | Admitting: Psychiatry

## 2014-03-09 ENCOUNTER — Encounter: Payer: Self-pay | Admitting: *Deleted

## 2014-03-09 ENCOUNTER — Emergency Department
Admission: EM | Admit: 2014-03-09 | Discharge: 2014-03-09 | Disposition: A | Discharge status: Home / Self Care | Payer: Medicaid Other | Source: Home / Self Care | Attending: Family Medicine | Admitting: Family Medicine

## 2014-03-09 DIAGNOSIS — M7651 Patellar tendinitis, right knee: Secondary | ICD-10-CM

## 2014-03-09 DIAGNOSIS — M222X2 Patellofemoral disorders, left knee: Secondary | ICD-10-CM

## 2014-03-09 DIAGNOSIS — M7652 Patellar tendinitis, left knee: Secondary | ICD-10-CM

## 2014-03-09 DIAGNOSIS — M222X1 Patellofemoral disorders, right knee: Secondary | ICD-10-CM

## 2014-03-09 MED ORDER — PREDNISONE 20 MG PO TABS: 20.0000 mg | ORAL_TABLET | Freq: Two times a day (BID) | ORAL | Status: DC

## 2014-03-09 MED ORDER — HYDROCODONE-ACETAMINOPHEN 5-325 MG PO TABS: ORAL_TABLET | ORAL | Status: DC

## 2014-03-09 NOTE — ED Provider Notes (Signed)
CSN: [id number]     Arrival date & time 03/09/14  1612 History   First MD Initiated Contact with Patient 03/09/14 1642     Chief Complaint  Patient presents with  . Knee Pain    bilateral      HPI Comments: Patient complains of two week history of bilateral anterior knee pain, somewhat worse on the left.  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  She has pain when extending her knees.  The pain is worse when her knees are flexed.  She had a similar condition two years ago that responded to oral prednisone but not an NSAID.  Patient is a 38 y.o. female presenting with knee pain. The history is provided by the patient.  Knee Pain Location:  Knee Time since incident:  2 weeks Knee location:  L knee and R knee Pain details:    Quality:  Dull and burning   Radiates to:  Does not radiate   Severity:  Mild   Onset quality:  Sudden   Duration:  2 weeks   Timing:  Constant   Progression:  Unchanged Chronicity:  Recurrent Prior injury to area:  No Worsened by:  Bearing weight and flexion Ineffective treatments:  None tried Associated symptoms: stiffness   Associated symptoms: no back pain, no decreased ROM, no fever, no numbness, no swelling and no tingling     Past Medical History  Diagnosis Date  . Anxiety   . Bipolar disorder   . Carpal tunnel syndrome   . Fibromyalgia   . Asthma   . Hypothyroidism    History reviewed. No pertinent past surgical history. Family History  Problem Relation Age of Onset  . Depression Mother   . Cancer Mother     lung  . Depression Maternal Aunt   . ADD / ADHD Son   . Anxiety disorder Son   . ADD / ADHD Son   . Anxiety disorder Son    History  Substance Use Topics  . Smoking status: Never Smoker   . Smokeless tobacco: Never Used  . Alcohol Use: 0.0 oz/week     Comment: Per patient report one  drink of wine every 2-3 months if that.   OB History    No data available     Review of Systems   Constitutional: Negative for fever.  Musculoskeletal: Positive for stiffness. Negative for back pain.  All other systems reviewed and are negative.   Allergies  Review of patient's allergies indicates no known allergies.  Home Medications   Prior to Admission medications   Medication Sig Start Date End Date Taking? Authorizing Provider  ADVAIR DISKUS 250-50 MCG/DOSE AEPB  09/25/11  Yes Historical Provider, MD  buPROPion (WELLBUTRIN XL) 150 MG 24 hr tablet Take 3 tablets (450 mg total) by mouth as directed. Take at 2 PM. 12/24/12  Yes Larena SoxShaji J Puthuvel, MD  lamoTRIgine (LAMICTAL) 200 MG tablet TAKE 1 TABLET (200 MG TOTAL) BY MOUTH DAILY. 08/09/12  Yes Jamse MeadMary Patricia Moore, MD  levothyroxine (SYNTHROID, LEVOTHROID) 50 MCG tablet  09/24/11  Yes Historical Provider, MD  meloxicam (MOBIC) 15 MG tablet Take 15 mg by mouth daily.   Yes Historical Provider, MD  Milnacipran HCl (SAVELLA) 100 MG TABS Take 100 mg by mouth 2 (two) times daily. Per Irene LimboStephanie Tickerhoff    Yes Historical Provider, MD  topiramate (TOPAMAX) 100 MG tablet TAKE 1 TABLET (100 MG TOTAL) BY MOUTH 2 (TWO) TIMES DAILY. 12/22/12  Yes Jamse MeadMary Patricia Moore, MD  ALPRAZolam Prudy Feeler(XANAX) 1 MG tablet TAKE 1 TABLET 3 TIMES A DAY AS NEEDED 01/03/13   Jamse MeadMary Patricia Moore, MD  Biotin 1000 MCG tablet Take 1,000 mcg by mouth daily.    Historical Provider, MD  cyclobenzaprine (FLEXERIL) 10 MG tablet Take 10 mg by mouth 3 (three) times daily as needed. Per Irene LimboStephanie Tickerhoff. Reports tries to only take one time per day.    Historical Provider, MD  HYDROcodone-acetaminophen (NORCO/VICODIN) 5-325 MG per tablet Take one by mouth at bedtime as needed for pain 03/09/14   Lattie HawStephen A Teliah Buffalo, MD  predniSONE (DELTASONE) 20 MG tablet Take 1 tablet (20 mg total) by mouth 2 (two) times daily. Take with food. 03/09/14   Lattie HawStephen A Jury Caserta, MD   BP 108/75 mmHg  Pulse 97  Resp 12  Wt 145 lb (65.772 kg)  SpO2 100%  LMP 03/08/2014 Physical Exam  Constitutional: She is  oriented to person, place, and time. She appears well-developed and well-nourished. No distress.  HENT:  Head: Normocephalic.  Eyes: Conjunctivae are normal. Pupils are equal, round, and reactive to light.  Musculoskeletal:       Right knee: She exhibits abnormal patellar mobility. She exhibits normal range of motion, no swelling, no effusion, no ecchymosis, no deformity, no laceration, no erythema, normal alignment, no LCL laxity, no bony tenderness, normal meniscus and no MCL laxity. Tenderness found. Patellar tendon tenderness noted.       Left knee: She exhibits abnormal patellar mobility. She exhibits normal range of motion, no swelling, no effusion, no ecchymosis, no deformity, no laceration, no erythema, normal alignment, no LCL laxity, no bony tenderness, normal meniscus and no MCL laxity. Tenderness found. Patellar tendon tenderness noted.  Neurological: She is alert and oriented to person, place, and time.  Skin: Skin is warm and dry. No rash noted.  Nursing note and vitals reviewed.   ED Course  Procedures  none     MDM   1. Patellofemoral pain syndrome, left   2. Patellofemoral pain syndrome, right   3. Patellar tendonitis, left   4. Patellar tendonitis, right    Begin prednisone burst.  Lortab at bedtime for pain May continue Voltaren gel after finishing prednisone.  Apply ice pack for 20 to 30 minutes, 3 to 4 times daily  Continue until pain decreases.  Begin knee exercises. Followup with Dr. Rodney Langtonhomas Thekkekandam ([facility] if not improving about three weeks.    Lattie HawStephen A Angala Hilgers, MD 03/10/14 [phone]

## 2014-03-09 NOTE — ED Notes (Signed)
Tonya Montgomery c/o bilateral knee pain with gradual onset x 2 weeks.

## 2014-03-09 NOTE — Discharge Instructions (Signed)
May continue Voltaren gel after finishing prednisone.  Apply ice pack for 20 to 30 minutes, 3 to 4 times daily  Continue until pain decreases.  Begin knee exercises.   Patellofemoral Syndrome If you have had pain in the front of your knee for a long time, chances are good that you have patellofemoral syndrome. The word patella refers to the kneecap. Femoral (or femur) refers to the thigh bone. That is the bone the kneecap sits on. The kneecap is shaped like a triangle. Its job is to protect the knee and to improve the efficiency of your thigh muscles (quadriceps). The underside of the kneecap is made of smooth tissue (cartilage). This lets the kneecap slide up and down as the knee moves. Sometimes this cartilage becomes soft. Your healthcare provider may say the cartilage breaks down. That is patellofemoral syndrome. It can affect one knee, or both. The condition is sometimes called patellofemoral pain syndrome. That is because the condition is painful. The pain usually gets worse with activity. Sitting for a long time with the knee bent also makes the pain worse. It usually gets better with rest and proper treatment. CAUSES  No one is sure why some people develop this problem and others do not. Runners often get it. One name for the condition is "runner's knee." However, some people run for years and never have knee pain. Certain things seem to make patellofemoral syndrome more likely. They include:  Moving out of alignment. The kneecap is supposed to move in a straight line when the thigh muscle pulls on it. Sometimes the kneecap moves in poor alignment. That can make the knee swell and hurt. Some experts believe it also wears down the cartilage.  Injury to the kneecap.  Strain on the knee. This may occur during sports activity. Soccer, running, skiing and cycling can put excess stress on the knee.  Being flat-footed or knock-kneed. SYMPTOMS   Knee pain.  Pain under the kneecap. This is usually  a dull, aching pain.  Pain in the knee when doing certain things: squatting, kneeling, going up or down stairs.  Pain in the knee when you stand up after sitting down for awhile.  Tightness in the knee.  Loss of muscle strength in the thigh.  Swelling of the knee. DIAGNOSIS  Healthcare providers often send people with knee pain to an orthopedic caregiver. This person has special training to treat problems with bones and joints. To decide what is causing your knee pain, your caregiver will probably:  Do a physical exam. This will probably include:  Asking about symptoms you have noticed.  Asking about your activities and any injuries.  Feeling your knee. Moving it. This will help test the knee's strength. It will also check alignment (whether the knee and leg are aligned normally).  Order some tests, such as:  Imaging tests. They create pictures of the inside of the knee. Tests may include:  X-rays.  Computed tomography (CT) scan. This uses X-rays and a computer to show more detail.  Magnetic resonance imaging (MRI). This test uses magnets, radio waves and a computer to make pictures. TREATMENT   Medication is almost always used first. It can relieve pain. It also can reduce swelling. Non-steroidal anti-inflammatory medicines (called NSAIDs) are usually suggested. Sometimes a stronger form is needed. A stronger form would require a prescription.  Other treatment may be needed after the swelling goes down. Possibilities include:  Exercise. Certain exercises can make the muscles around the knee stronger which  decreases the pressure on the knee cap. This includes the thigh muscle. Certain exercises also may be suggested to increase your flexibility.  A knee brace. This gives the knee extra support and helps align the movement of the knee cap.  Orthotics. These are special shoe inserts. They can help keep your leg and knee aligned.  Surgery is sometimes needed. This is rare.  Options include:  Arthroscopy. The surgeon uses a special tool to remove any damaged pieces of the kneecap. Only a few small incisions (cuts) are needed.  Realignment. This is open surgery. The goals are to reduce pressure and fix the way the kneecap moves. HOME CARE INSTRUCTIONS   Take any medication prescribed by your healthcare provider. Follow the directions carefully.  If your knee is swollen:  Put ice or cold packs on it. Do this for 20 to 30 minutes, 3 to 4 times a day.  Keep the knee raised. Make sure it is supported. Put a pillow under it.  Rest your knee. For example, take the elevator instead of the stairs for awhile. Or, take a break from sports activity that strain your knee. Try walking or swimming instead.  Whenever you are active:  Use an elastic bandage on your knee. This gives it support.  After any activity, put ice or cold packs on your knees. Do this for about 10 to 20 minutes.  Make sure you wear shoes that give good support. Make sure they are not worn down. The heels should not slant in or out. SEEK MEDICAL CARE IF:   Knee pain gets worse. Or it does not go away, even after taking pain medicine.  Swelling does not go down.  Your thigh muscle becomes weak.  You have an oral temperature above 102 F (38.9 C). SEEK IMMEDIATE MEDICAL CARE IF:  You have an oral temperature above 102 F (38.9 C), not controlled by medicine. Document Released: 03/26/2009 Document Revised: 06/30/2011 Document Reviewed: 06/27/2013 [facility] Patient Information 2015 [employer], [place]. This information is not intended to replace advice given to you by your health care provider. Make sure you discuss any questions you have with your health care provider.

## 2014-09-20 ENCOUNTER — Emergency Department (INDEPENDENT_AMBULATORY_CARE_PROVIDER_SITE_OTHER): Payer: Medicaid Other

## 2014-09-20 ENCOUNTER — Encounter: Payer: Self-pay | Admitting: Emergency Medicine

## 2014-09-20 ENCOUNTER — Emergency Department
Admission: EM | Admit: 2014-09-20 | Discharge: 2014-09-20 | Disposition: A | Discharge status: Home / Self Care | Payer: Medicaid Other | Source: Home / Self Care | Attending: Emergency Medicine | Admitting: Emergency Medicine

## 2014-09-20 DIAGNOSIS — T798XXA Other early complications of trauma, initial encounter: Secondary | ICD-10-CM

## 2014-09-20 DIAGNOSIS — M79645 Pain in left finger(s): Secondary | ICD-10-CM

## 2014-09-20 DIAGNOSIS — S60312A Abrasion of left thumb, initial encounter: Secondary | ICD-10-CM

## 2014-09-20 DIAGNOSIS — S60012A Contusion of left thumb without damage to nail, initial encounter: Secondary | ICD-10-CM | POA: Diagnosis not present

## 2014-09-20 DIAGNOSIS — L03012 Cellulitis of left finger: Secondary | ICD-10-CM

## 2014-09-20 MED ORDER — CEPHALEXIN 500 MG PO CAPS: 500.0000 mg | ORAL_CAPSULE | Freq: Three times a day (TID) | ORAL | Status: DC

## 2014-09-20 MED ORDER — HYDROCODONE-ACETAMINOPHEN 5-325 MG PO TABS: 1.0000 | ORAL_TABLET | ORAL | Status: DC | PRN

## 2014-09-20 NOTE — ED Notes (Signed)
Pt c/o scraping her left thumb on something x5 days ago. States since then it has started to look infected...redness warmth and tender. No fever

## 2014-09-20 NOTE — ED Provider Notes (Signed)
CSN: [id number]     Arrival date & time 09/20/14  1350 History   First MD Initiated Contact with Patient 09/20/14 1441     Chief Complaint  Patient presents with  . Hand Pain   (Consider location/radiation/quality/duration/timing/severity/associated sxs/prior Treatment) HPI 5 days ago, accidentally bumped and injured and scraped her left thumb on something. Then, every day, it looked more and more infected with redness and warmth and tenderness over left thumb, especially IPJ area. No drainage or fever or chills. No numbness or weakness. Sharp and dull pain left thumb is moderate to severe, especially with movement. Has tried ibuprofen without any relief. No cardiorespiratory symptoms. No nausea or vomiting or abdominal pain Past Medical History  Diagnosis Date  . Anxiety   . Bipolar disorder   . Carpal tunnel syndrome   . Fibromyalgia   . Asthma   . Hypothyroidism    History reviewed. No pertinent past surgical history. Family History  Problem Relation Age of Onset  . Depression Mother   . Cancer Mother     lung  . Depression Maternal Aunt   . ADD / ADHD Son   . Anxiety disorder Son   . ADD / ADHD Son   . Anxiety disorder Son    History  Substance Use Topics  . Smoking status: Never Smoker   . Smokeless tobacco: Never Used  . Alcohol Use: 0.0 oz/week     Comment: Per patient report one  drink of wine every 2-3 months if that.   OB History    No data available     Review of Systems Remainder of Review of Systems negative for acute change except as noted in the HPI.  Allergies  Review of patient's allergies indicates no known allergies.  Home Medications   Prior to Admission medications   Medication Sig Start Date End Date Taking? Authorizing Provider  ADVAIR DISKUS 250-50 MCG/DOSE AEPB  09/25/11   Historical Provider, MD  ALPRAZolam Prudy Feeler(XANAX) 1 MG tablet TAKE 1 TABLET 3 TIMES A DAY AS NEEDED 01/03/13   Elaina PatteeMary P Moore, MD  Biotin 1000 MCG tablet Take 1,000 mcg by  mouth daily.    Historical Provider, MD  buPROPion (WELLBUTRIN XL) 150 MG 24 hr tablet Take 3 tablets (450 mg total) by mouth as directed. Take at 2 PM. 12/24/12   Larena SoxShaji J Puthuvel, MD  cephALEXin (KEFLEX) 500 MG capsule Take 1 capsule (500 mg total) by mouth 3 (three) times daily. For 7-10 days 09/20/14   Lajean Manesavid Massey, MD  cyclobenzaprine (FLEXERIL) 10 MG tablet Take 10 mg by mouth 3 (three) times daily as needed. Per Irene LimboStephanie Tickerhoff. Reports tries to only take one time per day.    Historical Provider, MD  HYDROcodone-acetaminophen (NORCO/VICODIN) 5-325 MG per tablet Take 1-2 tablets by mouth every 4 (four) hours as needed for severe pain. Take with food. 09/20/14   Lajean Manesavid Massey, MD  lamoTRIgine (LAMICTAL) 200 MG tablet TAKE 1 TABLET (200 MG TOTAL) BY MOUTH DAILY. 08/09/12   Elaina PatteeMary P Moore, MD  levothyroxine (SYNTHROID, LEVOTHROID) 50 MCG tablet  09/24/11   Historical Provider, MD  meloxicam (MOBIC) 15 MG tablet Take 15 mg by mouth daily.    Historical Provider, MD  Milnacipran HCl (SAVELLA) 100 MG TABS Take 100 mg by mouth 2 (two) times daily. Per Irene LimboStephanie Tickerhoff     Historical Provider, MD  predniSONE (DELTASONE) 20 MG tablet Take 1 tablet (20 mg total) by mouth 2 (two) times daily. Take with food. 03/09/14  Lattie Haw, MD  topiramate (TOPAMAX) 100 MG tablet TAKE 1 TABLET (100 MG TOTAL) BY MOUTH 2 (TWO) TIMES DAILY. 12/22/12   Elaina Pattee, MD   BP 114/78 mmHg  Pulse 100  Temp(Src) 98.2 F (36.8 C) (Oral)  Wt 148 lb (67.132 kg)  SpO2 98% Physical Exam  Constitutional: She is oriented to person, place, and time. She appears well-developed and well-nourished. No distress.  HENT:  Head: Normocephalic and atraumatic.  Eyes: Conjunctivae and EOM are normal. Pupils are equal, round, and reactive to light. No scleral icterus.  Neck: Normal range of motion.  Cardiovascular: Normal rate.   Pulmonary/Chest: Effort normal.  Abdominal: She exhibits no distension.  Musculoskeletal: Normal range  of motion.       Hands: Neurological: She is alert and oriented to person, place, and time.  Skin: Skin is warm.  Psychiatric: She has a normal mood and affect.  Nursing note and vitals reviewed.  neurovascular distally intact. Capillary refill normal. Left thumb increases pain when she tries to flex and extend. Tendons intact. Fingernail intact  ED Course  Procedures (including critical care time) Labs Review Labs Reviewed - No data to display  Imaging Review Dg Finger Thumb Left  09/20/2014   CLINICAL DATA:  Larey Seat 1 week ago and injured left thumb. Persistent pain.  EXAM: LEFT THUMB 2+V  COMPARISON:  None.  FINDINGS: The joint spaces are maintained. No acute bony findings are identified. The visualized left hand and wrist appear normal.  IMPRESSION: No acute bony findings.   Electronically Signed   By: Rudie Meyer M.D.   On: 09/20/2014 14:33     MDM   1. Contusion of left thumb, initial encounter   2. Abrasion of left thumb, initial encounter   3. Cellulitis of left thumb    Treatment options discussed, as well as risks, benefits, alternatives. Patient voiced understanding and agreement with the following plans:   New Prescriptions   CEPHALEXIN (KEFLEX) 500 MG CAPSULE    Take 1 capsule (500 mg total) by mouth 3 (three) times daily. For 7-10 days   HYDROCODONE-ACETAMINOPHEN (NORCO/VICODIN) 5-325 MG PER TABLET    Take 1-2 tablets by mouth every 4 (four) hours as needed for severe pain. Take with food.   Polysporin dressing, wrapped in a bulky Kerlix dressing. Wound care discussed. Follow-up with your primary care doctor in 5-7 days if not improving, or sooner if symptoms become worse. Precautions discussed. Red flags discussed. Questions invited and answered. Patient voiced understanding and agreement.    Lajean Manes, MD 09/20/14 [id number]

## 2015-02-15 ENCOUNTER — Encounter: Payer: Self-pay | Admitting: *Deleted

## 2015-02-15 ENCOUNTER — Emergency Department (INDEPENDENT_AMBULATORY_CARE_PROVIDER_SITE_OTHER)
Admission: EM | Admit: 2015-02-15 | Discharge: 2015-02-15 | Disposition: A | Discharge status: Home / Self Care | Payer: Medicaid Other | Source: Home / Self Care | Attending: Family Medicine | Admitting: Family Medicine

## 2015-02-15 DIAGNOSIS — J040 Acute laryngitis: Secondary | ICD-10-CM | POA: Diagnosis not present

## 2015-02-15 DIAGNOSIS — J069 Acute upper respiratory infection, unspecified: Secondary | ICD-10-CM | POA: Diagnosis not present

## 2015-02-15 LAB — POCT INFLUENZA A/B
Influenza A, POC: NEGATIVE
Influenza B, POC: NEGATIVE

## 2015-02-15 LAB — POCT RAPID STREP A (OFFICE): Rapid Strep A Screen: NEGATIVE

## 2015-02-15 MED ORDER — BENZONATATE 100 MG PO CAPS: 100.0000 mg | ORAL_CAPSULE | Freq: Three times a day (TID) | ORAL | Status: AC

## 2015-02-15 MED ORDER — AZITHROMYCIN 250 MG PO TABS: 250.0000 mg | ORAL_TABLET | Freq: Every day | ORAL | Status: AC

## 2015-02-15 MED ORDER — IBUPROFEN 600 MG PO TABS: 600.0000 mg | ORAL_TABLET | Freq: Four times a day (QID) | ORAL | Status: AC | PRN

## 2015-02-15 NOTE — ED Notes (Signed)
Pt c/o sore throat, aches, and congestion x 6 days. Taken Nyquil otc.

## 2015-02-15 NOTE — ED Provider Notes (Signed)
CSN: [id number]     Arrival date & time 02/15/15  1635 History   First MD Initiated Contact with Patient 02/15/15 1638     Chief Complaint  Patient presents with  . Sore Throat  . Nasal Congestion   (Consider location/radiation/quality/duration/timing/severity/associated sxs/prior Treatment) HPI Pt is a 39yo female presenting to [facility] with c/o gradually worsening sore throat with body aches, congestion and mild intermittent non-produtive cough for 6 days.  She has been taking Nyquil with minimal relief.  Pt notes she lost her voice about 3-4 days ago.  Denies sick contacts or recent travel. She has not received the flu vaccine this year.  Denies n/v/d.    Past Medical History  Diagnosis Date  . Anxiety   . Bipolar disorder (HCC)   . Carpal tunnel syndrome   . Fibromyalgia   . Asthma   . Hypothyroidism    History reviewed. No pertinent past surgical history. Family History  Problem Relation Age of Onset  . Depression Mother   . Cancer Mother     lung  . Depression Maternal Aunt   . ADD / ADHD Son   . Anxiety disorder Son   . ADD / ADHD Son   . Anxiety disorder Son    Social History  Substance Use Topics  . Smoking status: Never Smoker   . Smokeless tobacco: Never Used  . Alcohol Use: 0.0 oz/week     Comment: Per patient report one  drink of wine every 2-3 months if that.   OB History    No data available     Review of Systems  Constitutional: Positive for fever, chills, appetite change and fatigue.  HENT: Positive for congestion, sore throat and voice change. Negative for ear pain and trouble swallowing.   Respiratory: Positive for cough. Negative for shortness of breath.   Cardiovascular: Negative for chest pain and palpitations.  Gastrointestinal: Negative for nausea, vomiting, abdominal pain and diarrhea.  Musculoskeletal: Positive for myalgias and arthralgias. Negative for back pain.  Skin: Negative for rash.    Allergies  Review of patient's allergies  indicates no known allergies.  Home Medications   Prior to Admission medications   Medication Sig Start Date End Date Taking? Authorizing Provider  ADVAIR DISKUS 250-50 MCG/DOSE AEPB  09/25/11  Yes Historical Provider, MD  ALPRAZolam Prudy Feeler) 1 MG tablet TAKE 1 TABLET 3 TIMES A DAY AS NEEDED 01/03/13  Yes Elaina Pattee, MD  buPROPion (WELLBUTRIN XL) 150 MG 24 hr tablet Take 3 tablets (450 mg total) by mouth as directed. Take at 2 PM. 12/24/12  Yes Larena Sox, MD  cyclobenzaprine (FLEXERIL) 10 MG tablet Take 10 mg by mouth 3 (three) times daily as needed. Per Irene Limbo. Reports tries to only take one time per day.   Yes Historical Provider, MD  lamoTRIgine (LAMICTAL) 200 MG tablet TAKE 1 TABLET (200 MG TOTAL) BY MOUTH DAILY. 08/09/12  Yes Elaina Pattee, MD  levothyroxine (SYNTHROID, LEVOTHROID) 50 MCG tablet  09/24/11  Yes Historical Provider, MD  Milnacipran HCl (SAVELLA) 100 MG TABS Take 100 mg by mouth 2 (two) times daily. Per Irene Limbo    Yes Historical Provider, MD  azithromycin (ZITHROMAX) 250 MG tablet Take 1 tablet (250 mg total) by mouth daily. Take first 2 tablets together, then 1 every day until finished. 02/15/15   Junius Finner, PA-C  benzonatate (TESSALON) 100 MG capsule Take 1 capsule (100 mg total) by mouth every 8 (eight) hours. 02/15/15   Junius Finner,  PA-C  Biotin 1000 MCG tablet Take 1,000 mcg by mouth daily.    Historical Provider, MD  ibuprofen (ADVIL,MOTRIN) 600 MG tablet Take 1 tablet (600 mg total) by mouth every 6 (six) hours as needed. 02/15/15   Junius FinnerErin O'Malley, PA-C  topiramate (TOPAMAX) 100 MG tablet TAKE 1 TABLET (100 MG TOTAL) BY MOUTH 2 (TWO) TIMES DAILY. 12/22/12   Elaina PatteeMary P Moore, MD   Meds Ordered and Administered this Visit  Medications - No data to display  BP 113/72 mmHg  Pulse 91  Temp(Src) 98.1 F (36.7 C) (Oral)  Resp 16  Wt 151 lb (68.493 kg)  SpO2 99%  LMP 02/15/2015 No data found.   Physical Exam  Constitutional: She appears  well-developed and well-nourished. No distress.  Pt appears mildly fatigued  HENT:  Head: Normocephalic and atraumatic.  Right Ear: Hearing, tympanic membrane, external ear and ear canal normal.  Left Ear: Hearing, tympanic membrane, external ear and ear canal normal.  Nose: Mucosal edema present. Right sinus exhibits no maxillary sinus tenderness and no frontal sinus tenderness. Left sinus exhibits no maxillary sinus tenderness and no frontal sinus tenderness.  Mouth/Throat: Uvula is midline and mucous membranes are normal. Posterior oropharyngeal erythema present. No oropharyngeal exudate, posterior oropharyngeal edema or tonsillar abscesses.  Eyes: Conjunctivae are normal. No scleral icterus.  Neck: Normal range of motion. Neck supple.  Hoarse voice. No stridor.   Cardiovascular: Normal rate, regular rhythm and normal heart sounds.   Pulmonary/Chest: Effort normal and breath sounds normal. No respiratory distress. She has no wheezes. She has no rales. She exhibits no tenderness.  No respiratory distress, able to speak in full sentences w/o difficulty. Lungs: CTAB  Abdominal: Soft. She exhibits no distension and no mass. There is no tenderness. There is no rebound and no guarding.  Musculoskeletal: Normal range of motion.  Lymphadenopathy:    She has no cervical adenopathy.  Neurological: She is alert.  Skin: Skin is warm and dry. She is not diaphoretic.  Nursing note and vitals reviewed.   ED Course  Procedures (including critical care time)  Labs Review Labs Reviewed  POCT RAPID STREP A (OFFICE)  POCT INFLUENZA A/B    Imaging Review No results found.    MDM   1. Laryngitis   2. Acute upper respiratory infection    Pt c/o worsening body aches, sore throat, hoarse voice and cough for 6 days.   Rapid strep and flu: negative No respiratory distress. Lungs: CTAB. O2 Sat 99% on RA Rx: azithromycin to cover atypical bacteria, tessalon and ibuprofen. Advised pt to use  acetaminophen and ibuprofen as needed for fever and pain. Encouraged rest and fluids. F/u with PCP in 1 week if not improving, sooner if worsening.  Pt verbalized understanding and agreement with tx plan.     Junius FinnerErin O'Malley, PA-C 02/15/15 1732

## 2015-02-15 NOTE — Discharge Instructions (Signed)
You may take 400-600mg  Ibuprofen (Motrin) every 6-8 hours for fever and pain  Alternate with Tylenol  You may take 500mg  Tylenol every 4-6 hours as needed for fever and pain  Follow-up with your primary care provider next week for recheck of symptoms if not improving.  Be sure to drink plenty of fluids and rest, at least 8hrs of sleep a night, preferably more while you are sick. Return urgent care or go to closest ER if you cannot keep down fluids/signs of dehydration, fever not reducing with Tylenol, difficulty breathing/wheezing, stiff neck, worsening condition, or other concerns (see below)   Laryngitis Laryngitis is inflammation of your vocal cords. This causes hoarseness, coughing, loss of voice, sore throat, or a dry throat. Your vocal cords are two bands of muscles that are found in your throat. When you speak, these cords come together and vibrate. These vibrations come out through your mouth as sound. When your vocal cords are inflamed, your voice sounds different. Laryngitis can be temporary (acute) or long-term (chronic). Most cases of acute laryngitis improve with time. Chronic laryngitis is laryngitis that lasts for more than three weeks. CAUSES Acute laryngitis may be caused by:  A viral infection.  Lots of talking, yelling, or singing. This is also called vocal strain.  Bacterial infections. Chronic laryngitis may be caused by:  Vocal strain.  Injury to your vocal cords.  Acid reflux (gastroesophageal reflux disease or GERD).  Allergies.  Sinus infection.  Smoking.  Alcohol abuse.  Breathing in chemicals or dust.  Growths on the vocal cords. RISK FACTORS Risk factors for laryngitis include:  Smoking.  Alcohol abuse.  Having allergies. SIGNS AND SYMPTOMS Symptoms of laryngitis may include:  Low, hoarse voice.  Loss of voice.  Dry cough.  Sore throat.  Stuffy nose. DIAGNOSIS Laryngitis may be diagnosed by:  Physical exam.  Throat  culture.  Blood test.  Laryngoscopy. This procedure allows your health care provider to look at your vocal cords with a mirror or viewing tube. TREATMENT Treatment for laryngitis depends on what is causing it. Usually, treatment involves resting your voice and using medicines to soothe your throat. However, if your laryngitis is caused by a bacterial infection, you may need to take antibiotic medicine. If your laryngitis is caused by a growth, you may need to have a procedure to remove it. HOME CARE INSTRUCTIONS  Drink enough fluid to keep your urine clear or pale yellow.  Breathe in moist air. Use a humidifier if you live in a dry climate.  Take medicines only as directed by your health care provider.  If you were prescribed an antibiotic medicine, finish it all even if you start to feel better.  Do not smoke cigarettes or electronic cigarettes. If you need help quitting, ask your health care provider.  Talk as little as possible. Also avoid whispering, which can cause vocal strain.  Write instead of talking. Do this until your voice is back to normal. SEEK MEDICAL CARE IF:  You have a fever.  You have increasing pain.  You have difficulty swallowing. SEEK IMMEDIATE MEDICAL CARE IF:  You cough up blood.  You have trouble breathing.   This information is not intended to replace advice given to you by your health care provider. Make sure you discuss any questions you have with your health care provider.   Document Released: 04/07/2005 Document Revised: 04/28/2014 Document Reviewed: 09/20/2013 Elsevier Interactive Patient Education [employer]

## 2015-10-22 IMAGING — CR DG FINGER THUMB 2+V*L*
3 series · 3 of 3 positions shown · non-contrast
Comparison: None.

CLINICAL DATA: Fell 1 week ago and injured left thumb. Persistent
pain.

EXAM:
LEFT THUMB 2+V

[finger ap]
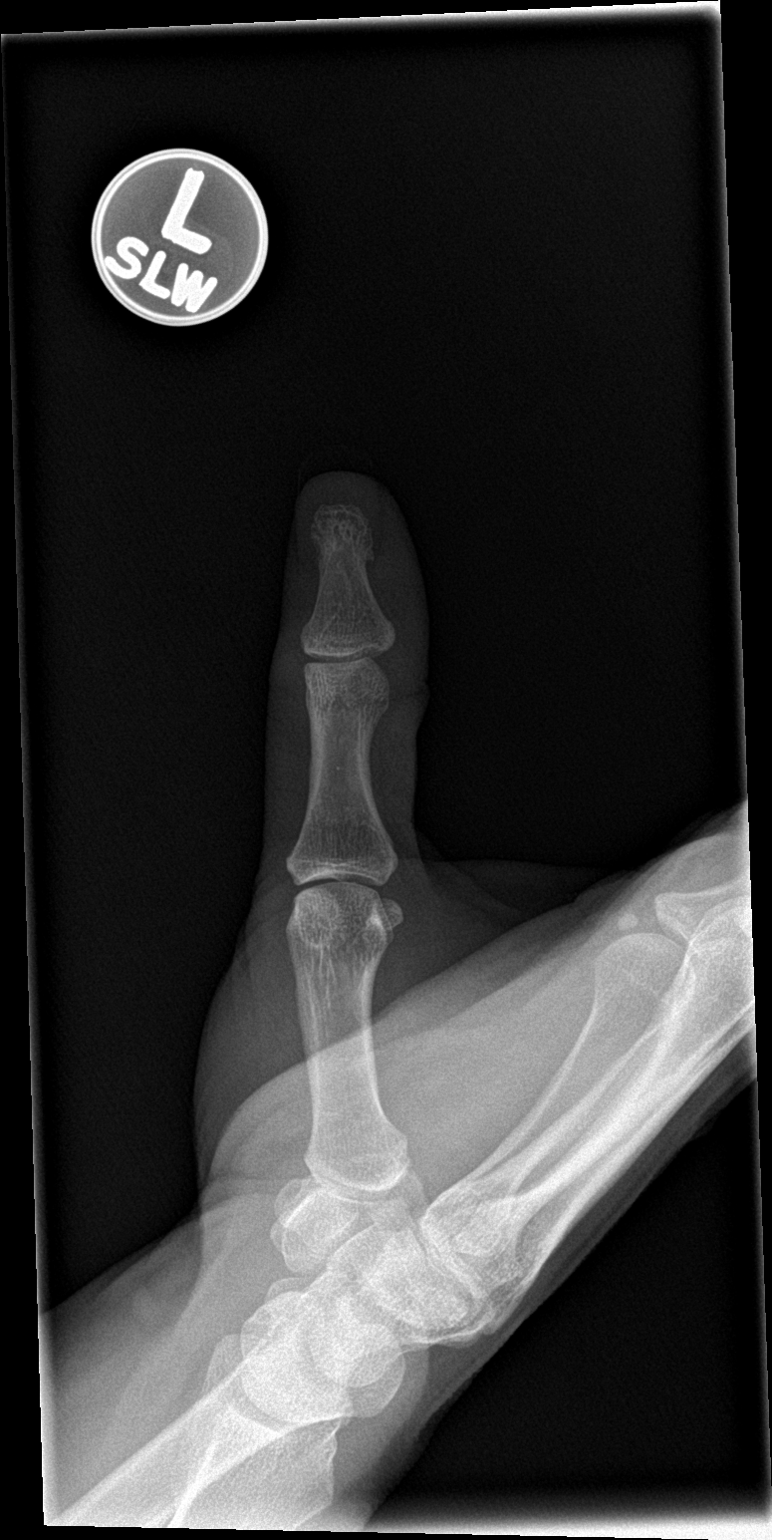

[finger obl]
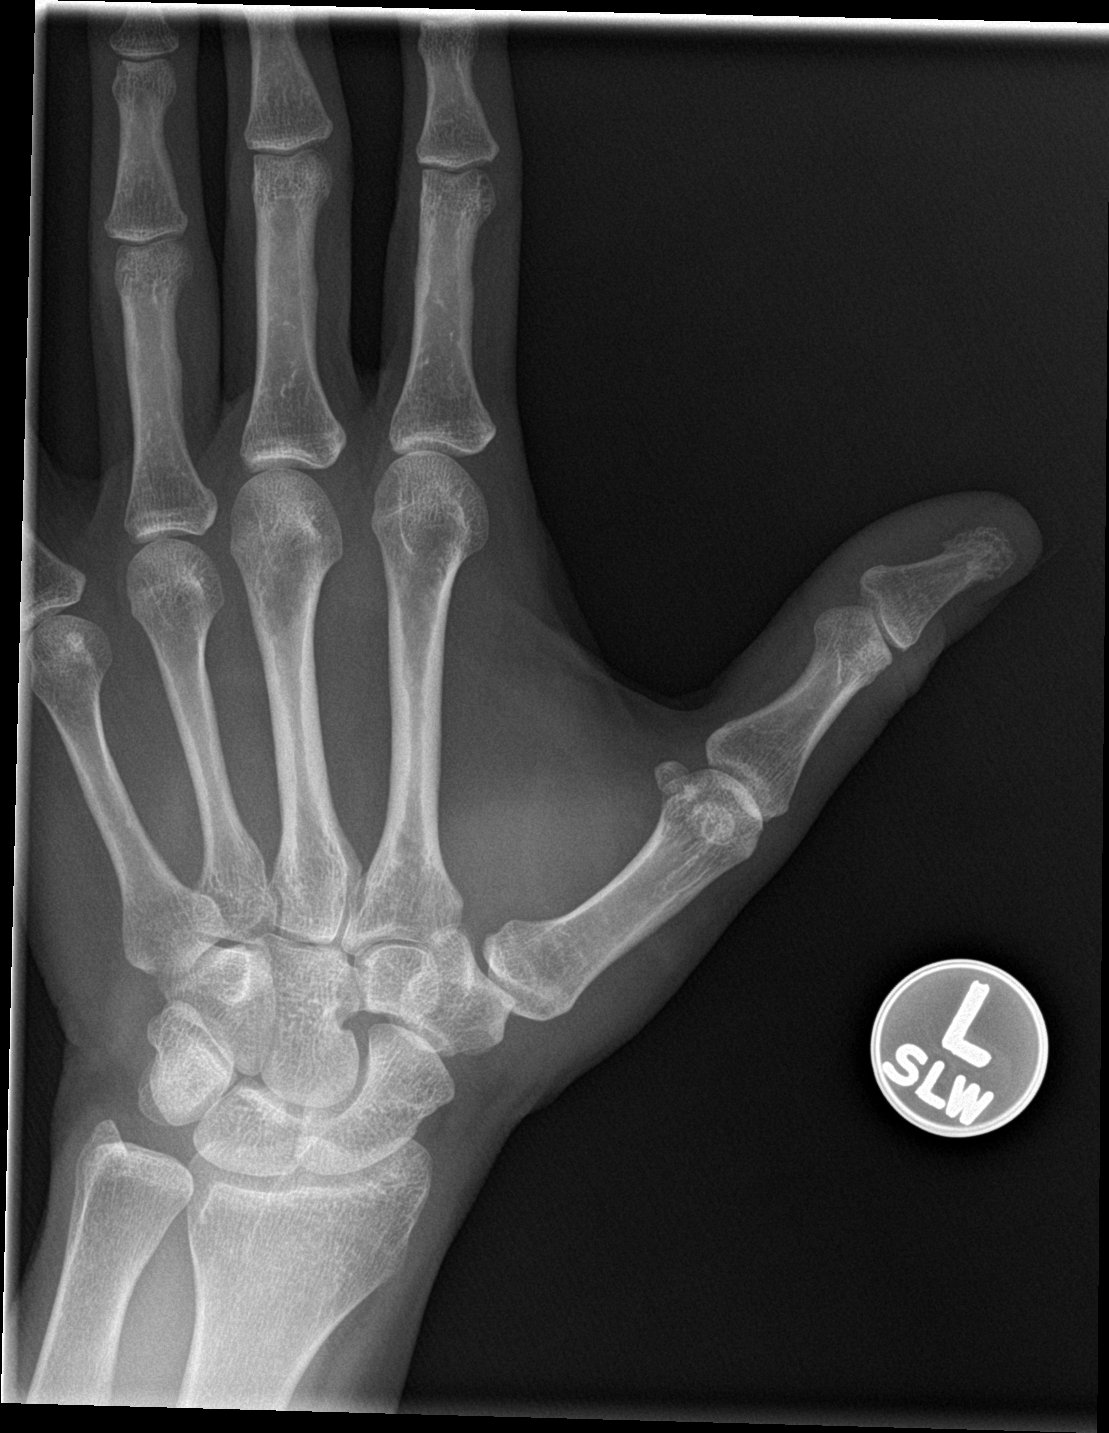

[finger lat]
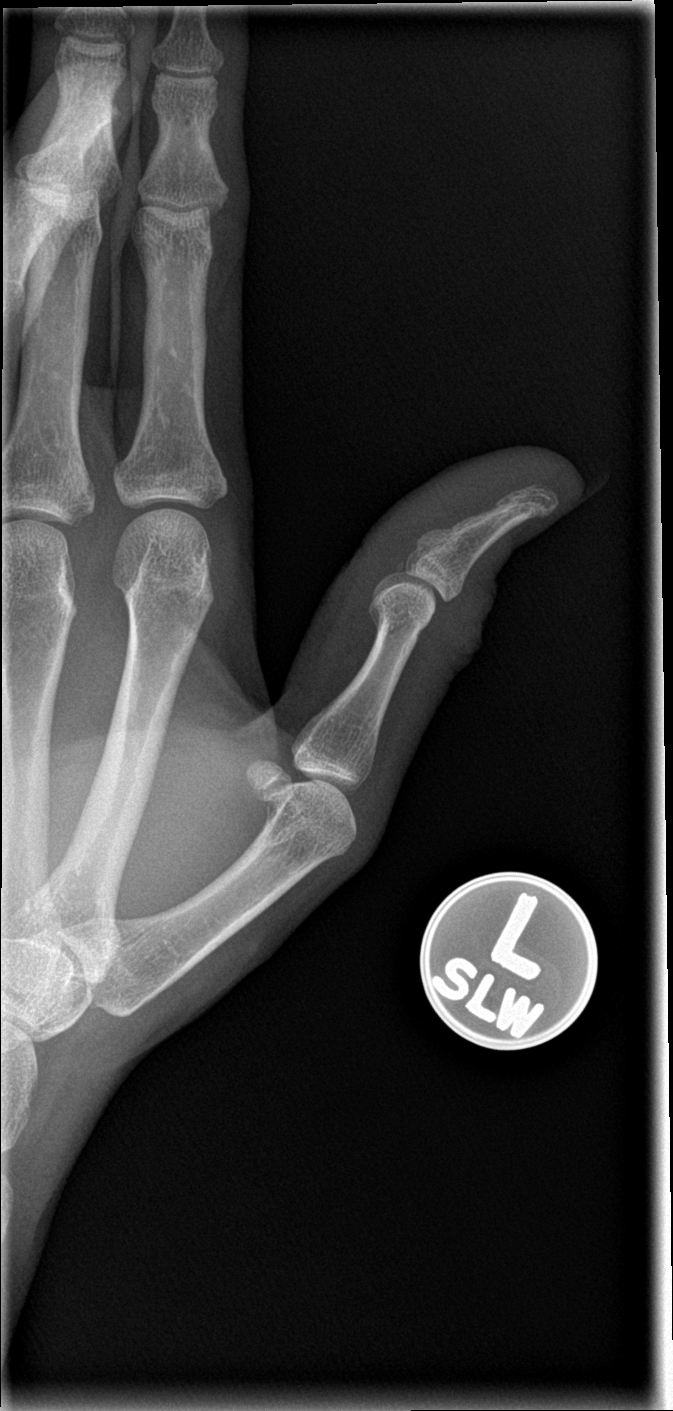

[3 of 3 positions shown; findings below may reference images not displayed]

FINDINGS: The joint spaces are maintained. No acute bony findings are
identified. The visualized left hand and wrist appear normal.
IMPRESSION: No acute bony findings.

## 2015-10-25 ENCOUNTER — Emergency Department
Admission: EM | Admit: 2015-10-25 | Discharge: 2015-10-25 | Discharge status: Absent without leave | Payer: Medicaid Other | Source: Home / Self Care | Attending: Emergency Medicine | Admitting: Emergency Medicine

## 2018-01-04 ENCOUNTER — Other Ambulatory Visit: Payer: Self-pay

## 2018-01-04 ENCOUNTER — Other Ambulatory Visit (HOSPITAL_COMMUNITY)
Admission: RE | Admit: 2018-01-04 | Discharge: 2018-01-04 | Disposition: A | Discharge status: Home / Self Care | Payer: Medicaid Other | Source: Ambulatory Visit | Attending: Family Medicine | Admitting: Family Medicine

## 2018-01-04 ENCOUNTER — Emergency Department (INDEPENDENT_AMBULATORY_CARE_PROVIDER_SITE_OTHER)
Admission: EM | Admit: 2018-01-04 | Discharge: 2018-01-04 | Disposition: A | Discharge status: Home / Self Care | Payer: Medicaid Other | Source: Home / Self Care | Attending: Family Medicine | Admitting: Family Medicine

## 2018-01-04 DIAGNOSIS — N898 Other specified noninflammatory disorders of vagina: Secondary | ICD-10-CM | POA: Insufficient documentation

## 2018-01-04 DIAGNOSIS — L299 Pruritus, unspecified: Secondary | ICD-10-CM

## 2018-01-04 DIAGNOSIS — R300 Dysuria: Secondary | ICD-10-CM

## 2018-01-04 DIAGNOSIS — J3489 Other specified disorders of nose and nasal sinuses: Secondary | ICD-10-CM

## 2018-01-04 LAB — POCT URINALYSIS DIP (MANUAL ENTRY)

## 2018-01-04 MED ORDER — FLUCONAZOLE 150 MG PO TABS: 150.0000 mg | ORAL_TABLET | Freq: Once | ORAL | 1 refills | Status: AC

## 2018-01-04 MED ORDER — CEPHALEXIN 500 MG PO CAPS: 500.0000 mg | ORAL_CAPSULE | Freq: Two times a day (BID) | ORAL | 0 refills | Status: AC

## 2018-01-04 MED ORDER — MUPIROCIN 2 % EX OINT: TOPICAL_OINTMENT | CUTANEOUS | 0 refills | Status: AC

## 2018-01-04 MED ORDER — PREDNISONE 20 MG PO TABS: ORAL_TABLET | ORAL | 0 refills | Status: AC

## 2018-01-04 NOTE — ED Triage Notes (Signed)
Burning started about 3 days ago, has had discharge for about 1 month.  Dr Hollice Espygibson gave medication that cleared it up but it is back.  Has sores in her nose that has been present about 3 weeks.  Body itches all the time.

## 2018-01-04 NOTE — Discharge Instructions (Signed)
°  You will be notified of test results in about 2 days.    Please follow up with family medicine or your OB/GYN if not improving by the end of the week.

## 2018-01-04 NOTE — ED Provider Notes (Signed)
[facility]    CSN: [id number] Arrival date & time: 01/04/18  1831     History   Chief Complaint Chief Complaint  Patient presents with  . Vaginal Pain  . Vaginal Discharge    HPI Tonya Montgomery is a 42 y.o. female.   HPI  Tonya Montgomery is a 42 y.o. female presenting to UC with c/o dysuria, vaginal itching, discharge, and burning for about 3 days. Similar symptoms about 1 month ago. She was prescribed medication by her PCP and symptoms did resolve but have now come back.  She also reports having sores in her nose. Hx of same, current sores have been present for about 3 weeks. She has not tried anything at home for sores. 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  Denies fever, chills n/v/d. She has had full body itching w/o rash for about a week.  No new soaps, lotions or medications. No exposure to bed bugs or scabies. No one else she is around has a rash. No hx of liver disease. No concern for STIs. She has tried benadryl and lotion w/o relief.    Past Medical History:  Diagnosis Date  . Anxiety   . Asthma   . Bipolar disorder (HCC)   . Carpal tunnel syndrome   . Fibromyalgia   . Hypothyroidism     Patient Active Problem List   Diagnosis Date Noted  . Bipolar I disorder, most recent episode (or current) depressed, severe, without mention of psychotic behavior 12/28/2012  . GAD (generalized anxiety disorder) 06/18/2011  . Bipolar 2 disorder (HCC) 03/18/2010  . ABSCESS, AXILLA, RIGHT 03/18/2010    History reviewed. No pertinent surgical history.  OB History   None      Home Medications    Prior to Admission medications   Medication Sig Start Date End Date Taking? Authorizing Provider  ADVAIR DISKUS 250-50 MCG/DOSE AEPB  09/25/11   [provider]  ALPRAZolam Prudy Feeler) 1 MG tablet TAKE 1 TABLET 3 TIMES A DAY AS NEEDED 01/03/13   Elaina Pattee, MD  azithromycin (ZITHROMAX) 250 MG tablet Take 1 tablet (250 mg total) by mouth daily. Take first 2 tablets together, then 1 every day until finished. 02/15/15   Lurene Shadow, PA-C  benzonatate (TESSALON) 100 MG capsule Take 1 capsule (100 mg total) by mouth every 8 (eight) hours. 02/15/15   Lurene Shadow, PA-C  Biotin 1000 MCG tablet Take 1,000 mcg by mouth daily.    [provider]  buPROPion (WELLBUTRIN XL) 150 MG 24 hr tablet Take 3 tablets (450 mg total) by mouth as directed. Take at 2 PM. 12/24/12   Puthuvel, Iven Finn, MD  cephALEXin (KEFLEX) 500 MG capsule Take 1 capsule (500 mg total) by mouth 2 (two) times daily. 01/04/18   Lurene Shadow, PA-C  cyclobenzaprine (FLEXERIL) 10 MG tablet Take 10 mg by mouth 3 (three) times daily as needed. Per Irene Limbo. Reports tries to only take one time per day.    [provider]  ibuprofen (ADVIL,MOTRIN) 600 MG tablet Take 1 tablet (600 mg total) by mouth every 6 (six) hours as needed. 02/15/15   Lurene Shadow, PA-C  lamoTRIgine (LAMICTAL) 200 MG tablet TAKE 1 TABLET (200 MG TOTAL) BY MOUTH DAILY. 08/09/12   Elaina Pattee, MD  levothyroxine (SYNTHROID, LEVOTHROID) 50 MCG tablet  09/24/11   [provider]  Milnacipran HCl (SAVELLA) 100 MG TABS Take 100 mg by mouth 2 (two) times daily. Per [facility]     [provider]  mupirocin ointment (BACTROBAN) 2 % Apply to nose 3 times daily for 5 days 01/04/18   Lurene Shadow, PA-C  predniSONE (DELTASONE) 20 MG tablet 3 tabs po day one, then 2 po daily x 4 days 01/04/18   Lurene Shadow, PA-C  topiramate (TOPAMAX) 100 MG tablet TAKE 1 TABLET (100 MG TOTAL) BY MOUTH 2 (TWO) TIMES DAILY. 12/22/12   Elaina Pattee, MD    Family History Family History  Problem Relation Age of Onset  . Depression Mother   . Cancer Mother        lung  . Anxiety disorder Son   . Depression Maternal Aunt   . ADD / ADHD Son   . Anxiety disorder Son   . ADD / ADHD Son      Social History Social History   Tobacco Use  . Smoking status: Never Smoker  . Smokeless tobacco: Never Used  Substance Use Topics  . Alcohol use: Yes    Comment: Per patient report one  drink of wine every 2-3 months if that.  . Drug use: No     Allergies   Patient has no known allergies.   Review of Systems Review of Systems  Constitutional: Negative for chills and fever.  HENT: Negative for congestion, ear pain, sore throat, trouble swallowing and voice change.        Nose sores  Respiratory: Negative for cough and shortness of breath.   Cardiovascular: Negative for chest pain and palpitations.  Gastrointestinal: Negative for abdominal pain, diarrhea, nausea and vomiting.  Genitourinary: Positive for dysuria, vaginal discharge and vaginal pain (itching and burning. ). Negative for frequency and urgency.  Musculoskeletal: Negative for arthralgias, back pain and myalgias.  Skin: Negative for rash and wound.     Physical Exam Triage Vital Signs ED Triage Vitals  Enc Vitals Group     BP 01/04/18 1859 108/72     Pulse Rate 01/04/18 1859 92     Resp --      Temp 01/04/18 1859 97.9 F (36.6 C)     Temp Source 01/04/18 1859 Oral     SpO2 01/04/18 1859 100 %     Weight 01/04/18 1900 137 lb (62.1 kg)     Height 01/04/18 1900 5\' 7"  (1.702 m)     Head Circumference --      Peak Flow --      Pain Score 01/04/18 1859 4     Pain Loc --      Pain Edu? --      Excl. in GC? --    No data found.  Updated Vital Signs BP 108/72 (BP Location: Right Arm)   Pulse 92   Temp 97.9 F (36.6 C) (Oral)   Ht 5\' 7"  (1.702 m)   Wt 137 lb (62.1 kg)   SpO2 100%   BMI 21.46 kg/m   Visual Acuity Right Eye Distance:   Left Eye Distance:   Bilateral Distance:    Right Eye Near:   Left Eye Near:    Bilateral Near:     Physical Exam  Constitutional: She is oriented to person, place, and time. She appears well-developed and well-nourished. No distress.  HENT:  Head:  Normocephalic and atraumatic.  Nose: Mucosal edema and sinus tenderness present.    Mouth/Throat:  Oropharynx is clear and moist.  Erythematous sores inside both nares. Tender. Scant yellow crusting discharge.   Eyes: EOM are normal.  Neck: Normal range of motion.  Cardiovascular: Normal rate and regular rhythm.  Pulmonary/Chest: Effort normal and breath sounds normal. No stridor. No respiratory distress. She has no wheezes. She has no rales.  Abdominal: Soft. She exhibits no distension. There is no tenderness. There is no CVA tenderness.  Genitourinary:  Genitourinary Comments: deferred  Musculoskeletal: Normal range of motion.  Neurological: She is alert and oriented to person, place, and time.  Skin: Skin is warm and dry. No rash noted. She is not diaphoretic.  Psychiatric: She has a normal mood and affect. Her behavior is normal.  Nursing note and vitals reviewed.    UC Treatments / Results  Labs (all labs ordered are listed, but only abnormal results are displayed) Labs Reviewed  URINE CULTURE  POCT URINALYSIS DIP (MANUAL ENTRY)  CERVICOVAGINAL ANCILLARY ONLY    EKG None  Radiology No results found.  Procedures Procedures (including critical care time)  Medications Ordered in UC Medications - No data to display  Initial Impression / Assessment and Plan / UC Course  I have reviewed the triage vital signs and the nursing notes.  Pertinent labs & imaging results that were available during my care of the patient were reviewed by me and considered in my medical decision making (see chart for details).     Vaginal self swab sent to lab for Wet Prep.  UA: unable to be performed due to pt taking Azo Culture sent Will start pt on empiric treatment for UTI and vaginal yeast infection.  Itching- unable to find source of itching on exam. Will have pt try trial of prednisone. May also try OTC antihistamines. F/u with PCP   Final Clinical Impressions(s) / UC Diagnoses    Final diagnoses:  Vaginal discharge  Dysuria  Vaginal itching  Itching  Sore in nose     Discharge Instructions      You will be notified of test results in about 2 days.    Please follow up with family medicine or your OB/GYN if not improving by the end of the week.    ED Prescriptions    Medication Sig Dispense Auth. Provider   predniSONE (DELTASONE) 20 MG tablet 3 tabs po day one, then 2 po daily x 4 days 11 tablet Antonieta Slaven O, PA-C   cephALEXin (KEFLEX) 500 MG capsule Take 1 capsule (500 mg total) by mouth 2 (two) times daily. 14 capsule Doroteo GlassmanPhelps, Khaliyah Northrop O, PA-C   fluconazole (DIFLUCAN) 150 MG tablet Take 1 tablet (150 mg total) by mouth once for 1 dose. May repeat in 3 days if still having symptoms 1 tablet Doroteo GlassmanPhelps, Tylisa Alcivar O, PA-C   mupirocin ointment (BACTROBAN) 2 % Apply to nose 3 times daily for 5 days 22 g Lurene ShadowPhelps, Khaleah Duer O, [place]-C     Controlled Substance Prescriptions [place] Controlled Substance Registry consulted? Not Applicable   Rolla Platehelps, Lakara Weiland O, PA-C 01/05/18 [id number]

## 2018-01-05 LAB — URINE CULTURE
MICRO NUMBER:: 91108322
Result:: NO GROWTH
SPECIMEN QUALITY:: ADEQUATE

## 2018-01-06 LAB — CERVICOVAGINAL ANCILLARY ONLY
Bacterial vaginitis: NEGATIVE
Candida vaginitis: NEGATIVE
Chlamydia: NEGATIVE
Neisseria Gonorrhea: NEGATIVE
Trichomonas: NEGATIVE

## 2018-01-07 ENCOUNTER — Telehealth: Payer: Self-pay | Admitting: *Deleted

## 2018-01-07 NOTE — Telephone Encounter (Signed)
Callback: No answer. Left message on personal mobile voicemail, All labs negative, encouraged to f/u with GYN or PCP. Call back as needed.
